# Patient Record
Sex: Male | Born: 1935 | Race: White | Hispanic: No | Smoking: Former smoker
Health system: Southern US, Community
[De-identification: ages and names within clinical notes are randomized; demographics above are authoritative.]

## PROBLEM LIST (undated history)

## (undated) DIAGNOSIS — Z86718 Personal history of other venous thrombosis and embolism: Secondary | ICD-10-CM

## (undated) DIAGNOSIS — C3490 Malignant neoplasm of unspecified part of unspecified bronchus or lung: Secondary | ICD-10-CM

---

## 2003-11-26 ENCOUNTER — Other Ambulatory Visit: Payer: Self-pay

## 2003-11-26 ENCOUNTER — Inpatient Hospital Stay: Payer: Self-pay | Admitting: Internal Medicine

## 2006-11-09 ENCOUNTER — Ambulatory Visit: Payer: Self-pay | Admitting: Gastroenterology

## 2008-02-02 ENCOUNTER — Ambulatory Visit: Payer: Self-pay | Admitting: Internal Medicine

## 2009-07-29 ENCOUNTER — Inpatient Hospital Stay: Payer: Self-pay | Admitting: Internal Medicine

## 2009-10-18 ENCOUNTER — Ambulatory Visit: Payer: Self-pay | Admitting: Specialist

## 2013-01-02 ENCOUNTER — Ambulatory Visit: Payer: Self-pay

## 2013-01-23 ENCOUNTER — Ambulatory Visit: Payer: Self-pay | Admitting: Vascular Surgery

## 2013-01-23 LAB — BASIC METABOLIC PANEL
Anion Gap: 3 — ABNORMAL LOW (ref 7–16)
BUN: 14 mg/dL (ref 7–18)
CALCIUM: 9.3 mg/dL (ref 8.5–10.1)
Chloride: 104 mmol/L (ref 98–107)
Co2: 32 mmol/L (ref 21–32)
Creatinine: 1.01 mg/dL (ref 0.60–1.30)
EGFR (African American): >60
EGFR (Non-African Amer.): >60
Glucose: 103 mg/dL — ABNORMAL HIGH (ref 65–99)
Osmolality: 278 (ref 275–301)
Potassium: 3.9 mmol/L (ref 3.5–5.1)
Sodium: 139 mmol/L (ref 136–145)

## 2013-02-01 ENCOUNTER — Ambulatory Visit: Payer: Self-pay | Admitting: Vascular Surgery

## 2013-02-01 LAB — BUN: BUN: 21 mg/dL — ABNORMAL HIGH (ref 7–18)

## 2013-02-01 LAB — CREATININE, SERUM
Creatinine: 1.15 mg/dL (ref 0.60–1.30)
EGFR (Non-African Amer.): >60

## 2013-05-18 DIAGNOSIS — I10 Essential (primary) hypertension: Secondary | ICD-10-CM | POA: Insufficient documentation

## 2013-05-18 DIAGNOSIS — R05 Cough: Secondary | ICD-10-CM | POA: Insufficient documentation

## 2013-05-18 DIAGNOSIS — R059 Cough, unspecified: Secondary | ICD-10-CM | POA: Insufficient documentation

## 2013-05-26 DIAGNOSIS — J449 Chronic obstructive pulmonary disease, unspecified: Secondary | ICD-10-CM | POA: Insufficient documentation

## 2013-05-26 DIAGNOSIS — I739 Peripheral vascular disease, unspecified: Secondary | ICD-10-CM | POA: Insufficient documentation

## 2013-06-02 DIAGNOSIS — G939 Disorder of brain, unspecified: Secondary | ICD-10-CM | POA: Insufficient documentation

## 2013-06-02 DIAGNOSIS — I6782 Cerebral ischemia: Secondary | ICD-10-CM | POA: Insufficient documentation

## 2013-06-02 DIAGNOSIS — I341 Nonrheumatic mitral (valve) prolapse: Secondary | ICD-10-CM | POA: Insufficient documentation

## 2013-06-02 DIAGNOSIS — E278 Other specified disorders of adrenal gland: Secondary | ICD-10-CM | POA: Insufficient documentation

## 2013-06-07 ENCOUNTER — Encounter: Payer: Self-pay | Admitting: Podiatrist

## 2013-06-15 ENCOUNTER — Encounter: Payer: Self-pay | Admitting: Podiatrist

## 2013-06-15 ENCOUNTER — Ambulatory Visit (INDEPENDENT_AMBULATORY_CARE_PROVIDER_SITE_OTHER): Payer: Medicare Other | Admitting: Podiatrist

## 2013-06-15 VITALS — BP 151/88 | HR 56 | Resp 16

## 2013-06-15 DIAGNOSIS — B351 Tinea unguium: Secondary | ICD-10-CM

## 2013-06-15 DIAGNOSIS — M79609 Pain in unspecified limb: Secondary | ICD-10-CM

## 2013-06-15 NOTE — Progress Notes (Signed)
   HPI:  Patient presents today for follow up of foot and nail care. Denies any new complaints today.  Objective:  Patients chart is reviewed.  Vascular status reveals pedal pulses noted at 2 out of 4 dp and pt bilateral .  Neurological sensation is Normal to [employer] monofilament bilateral.  Patients nails are thickened, discolored, distrophic, friable and brittle with yellow-brown discoloration bilateral hallux nails only.  He is able to trim the remainder of the nails on his own. Patient subjectively relates they are painful with shoes and with ambulation of bilateral feet.  Assessment:  Symptomatic onychomycosis x 2 toenails.   Plan:  Discussed treatment options and alternatives.  The symptomatic toenails were debrided through manual an mechanical means without complication.  Return appointment recommended at routine intervals of 3 months    Trudie Buckler, DPM

## 2013-07-27 DIAGNOSIS — G8918 Other acute postprocedural pain: Secondary | ICD-10-CM | POA: Insufficient documentation

## 2014-02-15 ENCOUNTER — Ambulatory Visit: Payer: Self-pay | Admitting: Podiatrist

## 2014-02-19 ENCOUNTER — Ambulatory Visit: Payer: Self-pay

## 2014-03-05 ENCOUNTER — Ambulatory Visit (INDEPENDENT_AMBULATORY_CARE_PROVIDER_SITE_OTHER): Payer: Medicare Other | Admitting: Podiatry

## 2014-03-05 DIAGNOSIS — B351 Tinea unguium: Secondary | ICD-10-CM | POA: Diagnosis not present

## 2014-03-05 DIAGNOSIS — M79676 Pain in unspecified toe(s): Secondary | ICD-10-CM

## 2014-03-05 NOTE — Progress Notes (Signed)
° °  HPI:  Patient presents today for follow up of foot and nail care. Denies any new complaints today.  Objective:  Patients chart is reviewed.  Vascular status reveals pedal pulses noted at 2 out of 4 dp and pt bilateral .  Neurological sensation is Normal to [employer] monofilament bilateral.  Patients nails are thickened, discolored, distrophic, friable and brittle with yellow-brown discoloration bilateral hallux nails only.  He is able to trim the remainder of the nails on his own. Patient subjectively relates they are painful with shoes and with ambulation of bilateral feet.  Assessment:  Symptomatic onychomycosis x 2 toenails.   Plan:  Discussed treatment options and alternatives.  The symptomatic toenails were debrided through manual an mechanical means without complication.  Return appointment recommended at routine intervals of 3 month            Receiving chemo for lung CA.

## 2014-04-28 NOTE — Op Note (Signed)
PATIENT NAME:  Dustin Poole, Dustin Poole MR#:  [id number] DATE OF BIRTH:  09/11/1935  DATE OF PROCEDURE:  02/01/2013  PREOPERATIVE DIAGNOSES: 1.  Peripheral arterial disease with rest pain, right lower extremity.  2.  Hypertension.  3.  History of alcoholic pancreatitis.  4.  Hyperlipidemia.   POSTOPERATIVE DIAGNOSES:  1.  Peripheral arterial disease with rest pain, right lower extremity.  2.  Hypertension.  3.  History of alcoholic pancreatitis.  4.  Hyperlipidemia.   PROCEDURES:  1.  Catheter placement to right anterior tibial artery and right posterior tibial artery from left femoral approach.  2.   Aortogram and selective right lower extremity angiogram.  3.  Percutaneous transluminal angioplasty of right tibioperoneal trunk and posterior tibial artery with 3 mm diameter angioplasty balloon.  4.  Percutaneous transluminal angioplasty of right anterior tibial artery with 3 mm and 4 mm diameter angioplasty balloon.  5.  Percutaneous transluminal angioplasty of right popliteal artery with a 5 mm diameter angioplasty balloon. 6.  Percutaneous transluminal angioplasty of right common iliac artery with a 7 mm diameter angioplasty balloon.  7.  Balloon expandable stent placement to right common iliac artery with an 8 mm diameter x 59 mm length balloon expandable stent.  8.  StarClose closure device, left femoral artery.   SURGEON: Algernon Huxley, M.D.   ANESTHESIA: Local with moderate conscious sedation   BLOOD LOSS: Minimal.   FLUOROSCOPY TIME: 9.2 minutes, and 95 mL of contrast used.  INDICATION FOR PROCEDURE: A 79 year old white male who presented to our office earlier this month with ischemic rest pain of the right foot. He had a undetectable ABIs at that time. He was probably scheduled for angiogram a couple of weeks ago, which he had canceled and rescheduled for today. He is brought in today for angiography for further evaluation of his disease in hopes of treatment for limb salvage. Risks and  benefits were discussed. Informed consent was obtained.   DESCRIPTION OF PROCEDURE: The patient was brought to the vascular and interventional radiology suite. Groins were shaved and prepped, a sterile surgical field was created. The left femoral head was localized with fluoroscopy. The left femoral artery was accessed without difficulty with a Seldinger needle. A J-wire and a 5 French sheath were then placed. Pigtail catheter was placed in the aorta at the L1-L2 level, and AP aortogram was performed, which showed what appeared to be normal flow through the renal vessels bilaterally. I then pulled down the pigtail catheter, and the aortic bifurcation and pelvic obliques were performed. This showed a near-flush occlusion of the right common iliac artery, with reconstitution through the hypogastric artery and the external iliac artery on the right. I used a rim catheter and an Advantage wire. I was able to cross this lesion with surprisingly little difficulty and confirm intraluminal flow in the right external iliac artery. Selective right lower extremity angiogram was then performed. This demonstrated normal common femoral artery, profunda femoris artery, superficial femoral artery. The popliteal artery at the level of the knee had near-occlusive thrombus, which at the tibial trifurcation became occlusive. The anterior tibial artery was only occluded over a 2 cm subsegment proximally. The tibioperoneal trunk was occluded. The posterior tibial artery  reconstituted in the mid to upper calf. The patient was fully heparinized. I placed a 6 [place] [employer] sheath over the [employer] wire across the iliac occlusion.   I initially turned my attention to crossing the popliteal and tibial lesions, which I was able to get  a Kumpe catheter and a V18 wire into the posterior tibial artery without difficulty, and confirm intraluminal flow with the Kumpe catheter, with reconstitution of the posterior tibial artery. The  0.018 wire was then replaced as this had been present for several weeks. I did not feel that injection of tPA at this time was going to be of much benefit. It did appear more embolic in nature, and possibly acute on chronic disease. Balloon angioplasty was performed in the tibioperoneal trunk and posterior tibial artery with a 3 mm diameter angioplasty balloon inflated up to the distal popliteal artery. A waist was taken, which resolved with angioplasty. The popliteal artery was then treated with a 5 mm diameter angioplasty balloon from just above the origin of the anterior tibial artery up into the above-knee popliteal artery. Completion angiogram following this showed significant improvement in the popliteal artery, with the tibioperoneal trunk, and peroneal and posterior tibial arteries continued to show minimal flow. I then turned my attention to the anterior tibial artery, where a more robust vessel was present, with a shorter segment of occlusion. I was able to gain access to this without difficulty with the Kumpe catheter and the V-18 wire, and confirm intraluminal flow in the anterior tibial artery beyond the occlusion. The remainder of the vessel appeared pretty good to the foot. I then replaced the 0.018 wire and treated this initially with a 3 mm diameter angioplasty balloon, with a suboptimal result, upsized to a 4 mm diameter angioplasty balloon, with resolution of flow through the anterior tibial artery, and about a 40% to 50% residual stenosis. The popliteal artery had decent flow, as did the anterior tibial artery as well now. At this point, I turned my attention to the iliac lesion. The sheath was pulled back to the aortic bifurcation. We had exchange back for the Advantage wire. Angioplasty was performed to the right common iliac artery with a 7 mm diameter angioplasty balloon. Following angioplasty, there remained a high-grade residual stenosis throughout much of the common iliac artery, and I  elected to treat this with an 8 mm diameter x 59 mm length balloon expandable stent. This was deployed with its proximal endpoint about 0.5 cm below the aortic bifurcation, not to impinge upon the left iliac artery, and it terminated at the level of the iliac bifurcation. Brisk flow was seen through this following stent placement, which was markedly improved from baseline.   At this point, I elected to terminate the procedure. The sheath was pulled back to the ipsilateral external iliac artery, and oblique arteriogram was performed. StarClose closure device was deployed in the usual fashion, with excellent hemostatic result. The patient tolerated the procedure well, and was taken to the recovery room in stable condition.     ____________________________ Algernon Huxley, MD jsd:mr D: 02/01/2013 16:29:44 ET T: 02/01/2013 19:11:56 ET JOB#: [id number]  cc: Algernon Huxley, MD, <Dictator> John B. Sarina Ser, MD  Algernon Huxley MD ELECTRONICALLY SIGNED 02/07/2013 11:05

## 2014-12-05 ENCOUNTER — Ambulatory Visit (INDEPENDENT_AMBULATORY_CARE_PROVIDER_SITE_OTHER): Payer: Medicare Other | Admitting: Podiatry

## 2014-12-05 ENCOUNTER — Encounter: Payer: Self-pay | Admitting: Podiatry

## 2014-12-05 DIAGNOSIS — M79676 Pain in unspecified toe(s): Secondary | ICD-10-CM

## 2014-12-05 DIAGNOSIS — B351 Tinea unguium: Secondary | ICD-10-CM | POA: Diagnosis not present

## 2014-12-05 NOTE — Progress Notes (Signed)
He presents today with chief complaint of painful elongated toenails.  Objective: Vital signs are stable he is alert and oriented 3 pulses are palpable bilateral. Nails are thick yellow dystrophic onychomycotic and painful on palpation.  Assessment: Pain in limb secondary to onychomycosis 1 through 5 bilateral.  Plan: Discussed etiology pathology conservative versus surgical therapy debrided nails 1 through 5 bilateral covers her secondary pain. Follow up with him in 3 months.

## 2015-03-06 ENCOUNTER — Ambulatory Visit: Payer: Medicare Other | Admitting: Podiatry

## 2015-05-20 ENCOUNTER — Encounter: Payer: Self-pay | Admitting: Podiatry

## 2015-05-20 ENCOUNTER — Ambulatory Visit (INDEPENDENT_AMBULATORY_CARE_PROVIDER_SITE_OTHER): Payer: Medicare Other | Admitting: Podiatry

## 2015-05-20 DIAGNOSIS — M79676 Pain in unspecified toe(s): Secondary | ICD-10-CM | POA: Diagnosis not present

## 2015-05-20 DIAGNOSIS — B351 Tinea unguium: Secondary | ICD-10-CM

## 2015-05-20 NOTE — Progress Notes (Signed)
He presents today with a chief complaint of painful elongated toenails bilateral.  Objective: Vital signs are stable he is alert and oriented 3. He seems a little tired today. Her nails are thick yellow dystrophic with mycotic and painful palpation.  Assessment: History of malignant adrenal gland tumor. Pain in limb secondary to onychomycosis.  Plan: Debridement of toenails 1 through 5 bilateral covered service secondary to pain and will follow up with [place] on an as-needed basis.

## 2016-07-02 ENCOUNTER — Ambulatory Visit: Payer: Medicare Other | Admitting: Podiatry

## 2016-07-23 ENCOUNTER — Encounter: Payer: Self-pay | Admitting: Podiatry

## 2016-07-23 ENCOUNTER — Ambulatory Visit (INDEPENDENT_AMBULATORY_CARE_PROVIDER_SITE_OTHER): Payer: Medicare Other | Admitting: Podiatry

## 2016-07-23 DIAGNOSIS — M79676 Pain in unspecified toe(s): Secondary | ICD-10-CM | POA: Diagnosis not present

## 2016-07-23 DIAGNOSIS — B351 Tinea unguium: Secondary | ICD-10-CM | POA: Diagnosis not present

## 2016-07-23 NOTE — Progress Notes (Signed)
Complaint:  Visit Type: Patient returns to my office for continued preventative foot care services. Complaint: Patient states" my nails have grown long and thick and become painful to walk and wear shoes"  The patient presents for preventative foot care services. No changes to ROS  Podiatric Exam: Vascular: dorsalis pedis and posterior tibial pulses are palpable bilateral. Capillary return is immediate. Temperature gradient is WNL. Skin turgor WNL  Sensorium: Normal Semmes [employer] monofilament test. Normal tactile sensation bilaterally. Nail Exam: Pt has thick disfigured discolored nails with subungual debris noted bilateral entire nail hallux through fifth toenails Ulcer Exam: There is no evidence of ulcer or pre-ulcerative changes or infection. Orthopedic Exam: Muscle tone and strength are WNL. No limitations in general ROM. No crepitus or effusions noted. Foot type and digits show no abnormalities. Bony prominences are unremarkable. Ankle swelling  B/L Skin: No Porokeratosis. No infection or ulcers  Diagnosis:  Onychomycosis, , Pain in right toe, pain in left toes  Treatment & Plan Procedures and Treatment: Consent by patient was obtained for treatment procedures. The patient understood the discussion of treatment and procedures well. All questions were answered thoroughly reviewed. Debridement of mycotic and hypertrophic toenails, 1 through 5 bilateral and clearing of subungual debris. No ulceration, no infection noted.  Return Visit-Office Procedure: Patient instructed to return to the office for a follow up visit 3 months for continued evaluation and treatment.    Gardiner Barefoot DPM

## 2016-10-26 ENCOUNTER — Ambulatory Visit: Payer: Medicare Other | Admitting: Podiatry

## 2016-10-29 ENCOUNTER — Ambulatory Visit: Payer: Medicare Other | Admitting: Podiatry

## 2017-01-07 ENCOUNTER — Inpatient Hospital Stay
Admission: EM | Admit: 2017-01-07 | Discharge: 2017-02-05 | DRG: 699 | Disposition: E | Attending: Internal Medicine | Admitting: Internal Medicine

## 2017-01-07 ENCOUNTER — Encounter: Payer: Self-pay | Admitting: Emergency Medicine

## 2017-01-07 ENCOUNTER — Other Ambulatory Visit: Payer: Self-pay

## 2017-01-07 ENCOUNTER — Emergency Department

## 2017-01-07 DIAGNOSIS — R64 Cachexia: Secondary | ICD-10-CM | POA: Diagnosis present

## 2017-01-07 DIAGNOSIS — F419 Anxiety disorder, unspecified: Secondary | ICD-10-CM | POA: Diagnosis present

## 2017-01-07 DIAGNOSIS — N401 Enlarged prostate with lower urinary tract symptoms: Secondary | ICD-10-CM | POA: Diagnosis present

## 2017-01-07 DIAGNOSIS — M069 Rheumatoid arthritis, unspecified: Secondary | ICD-10-CM | POA: Diagnosis present

## 2017-01-07 DIAGNOSIS — L89152 Pressure ulcer of sacral region, stage 2: Secondary | ICD-10-CM | POA: Diagnosis present

## 2017-01-07 DIAGNOSIS — Z86718 Personal history of other venous thrombosis and embolism: Secondary | ICD-10-CM

## 2017-01-07 DIAGNOSIS — R338 Other retention of urine: Secondary | ICD-10-CM | POA: Diagnosis present

## 2017-01-07 DIAGNOSIS — Z87440 Personal history of urinary (tract) infections: Secondary | ICD-10-CM

## 2017-01-07 DIAGNOSIS — T83091A Other mechanical complication of indwelling urethral catheter, initial encounter: Principal | ICD-10-CM

## 2017-01-07 DIAGNOSIS — L8990 Pressure ulcer of unspecified site, unspecified stage: Secondary | ICD-10-CM

## 2017-01-07 DIAGNOSIS — R310 Gross hematuria: Secondary | ICD-10-CM | POA: Diagnosis not present

## 2017-01-07 DIAGNOSIS — Z681 Body mass index (BMI) 19 or less, adult: Secondary | ICD-10-CM

## 2017-01-07 DIAGNOSIS — R319 Hematuria, unspecified: Secondary | ICD-10-CM | POA: Diagnosis present

## 2017-01-07 DIAGNOSIS — Z7901 Long term (current) use of anticoagulants: Secondary | ICD-10-CM

## 2017-01-07 DIAGNOSIS — Z515 Encounter for palliative care: Secondary | ICD-10-CM | POA: Diagnosis not present

## 2017-01-07 DIAGNOSIS — Z7982 Long term (current) use of aspirin: Secondary | ICD-10-CM

## 2017-01-07 DIAGNOSIS — N320 Bladder-neck obstruction: Secondary | ICD-10-CM

## 2017-01-07 DIAGNOSIS — C3490 Malignant neoplasm of unspecified part of unspecified bronchus or lung: Secondary | ICD-10-CM | POA: Diagnosis present

## 2017-01-07 DIAGNOSIS — Z87891 Personal history of nicotine dependence: Secondary | ICD-10-CM

## 2017-01-07 DIAGNOSIS — F329 Major depressive disorder, single episode, unspecified: Secondary | ICD-10-CM | POA: Diagnosis present

## 2017-01-07 DIAGNOSIS — Z66 Do not resuscitate: Secondary | ICD-10-CM | POA: Diagnosis present

## 2017-01-07 DIAGNOSIS — E871 Hypo-osmolality and hyponatremia: Secondary | ICD-10-CM

## 2017-01-07 DIAGNOSIS — Y738 Miscellaneous gastroenterology and urology devices associated with adverse incidents, not elsewhere classified: Secondary | ICD-10-CM | POA: Diagnosis present

## 2017-01-07 HISTORY — DX: Malignant neoplasm of unspecified part of unspecified bronchus or lung: C34.90

## 2017-01-07 HISTORY — DX: Personal history of other venous thrombosis and embolism: Z86.718

## 2017-01-07 LAB — CBC WITH DIFFERENTIAL/PLATELET
BASOS PCT: 1 %
Basophils Absolute: 0.1 10*3/uL (ref 0–0.1)
EOS ABS: 0 10*3/uL (ref 0–0.7)
Eosinophils Relative: 0 %
HEMATOCRIT: 32.1 % — AB (ref 40.0–52.0)
Hemoglobin: 10.5 g/dL — ABNORMAL LOW (ref 13.0–18.0)
LYMPHS ABS: 0.3 10*3/uL — AB (ref 1.0–3.6)
Lymphocytes Relative: 7 %
MCH: 31.3 pg (ref 26.0–34.0)
MCHC: 32.8 g/dL (ref 32.0–36.0)
MCV: 95.3 fL (ref 80.0–100.0)
MONO ABS: 0.1 10*3/uL — AB (ref 0.2–1.0)
Monocytes Relative: 1 %
NEUTROS ABS: 4.2 10*3/uL (ref 1.4–6.5)
Neutrophils Relative %: 91 %
Platelets: 482 10*3/uL — ABNORMAL HIGH (ref 150–440)
RBC: 3.36 MIL/uL — ABNORMAL LOW (ref 4.40–5.90)
RDW: 20.8 % — AB (ref 11.5–14.5)
WBC: 4.6 10*3/uL (ref 3.8–10.6)

## 2017-01-07 LAB — URINALYSIS, COMPLETE (UACMP) WITH MICROSCOPIC
BACTERIA UA: NONE SEEN
SQUAMOUS EPITHELIAL / LPF: NONE SEEN
Specific Gravity, Urine: 1.019 (ref 1.005–1.030)
WBC UA: NONE SEEN WBC/hpf (ref 0–5)

## 2017-01-07 LAB — BASIC METABOLIC PANEL
Anion gap: 10 (ref 5–15)
BUN: 17 mg/dL (ref 6–20)
CALCIUM: 8.6 mg/dL — AB (ref 8.9–10.3)
CHLORIDE: 97 mmol/L — AB (ref 101–111)
CO2: 21 mmol/L — AB (ref 22–32)
CREATININE: 0.84 mg/dL (ref 0.61–1.24)
GFR calc non Af Amer: >60 mL/min (ref >60)
Glucose, Bld: 178 mg/dL — ABNORMAL HIGH (ref 65–99)
Potassium: 4.5 mmol/L (ref 3.5–5.1)
Sodium: 128 mmol/L — ABNORMAL LOW (ref 135–145)

## 2017-01-07 LAB — PROTIME-INR
INR: 1.47
Prothrombin Time: 17.7 seconds — ABNORMAL HIGH (ref 11.4–15.2)

## 2017-01-07 LAB — APTT: aPTT: 43 seconds — ABNORMAL HIGH (ref 24–36)

## 2017-01-07 MED ORDER — ONDANSETRON HCL 4 MG PO TABS: 4.0000 mg | ORAL_TABLET | Freq: Four times a day (QID) | ORAL | Status: DC | PRN

## 2017-01-07 MED ORDER — FOLIC ACID 1 MG PO TABS
1.0000 mg | ORAL_TABLET | Freq: Every day | ORAL | Status: DC
  Administered 2017-01-09: 1 mg via ORAL
  Filled 2017-01-07: qty 1

## 2017-01-07 MED ORDER — SODIUM CHLORIDE 0.9 % IV BOLUS (SEPSIS)
500.0000 mL | Freq: Once | INTRAVENOUS | Status: AC
  Administered 2017-01-07: 250 mL via INTRAVENOUS

## 2017-01-07 MED ORDER — ACETAMINOPHEN 650 MG RE SUPP: 650.0000 mg | Freq: Four times a day (QID) | RECTAL | Status: DC | PRN

## 2017-01-07 MED ORDER — OXYCODONE-ACETAMINOPHEN 5-325 MG PO TABS
1.0000 | ORAL_TABLET | ORAL | Status: DC | PRN
  Administered 2017-01-07: 1 via ORAL
  Filled 2017-01-07: qty 1

## 2017-01-07 MED ORDER — LORAZEPAM 0.5 MG PO TABS
0.5000 mg | ORAL_TABLET | Freq: Four times a day (QID) | ORAL | Status: DC | PRN
  Administered 2017-01-07: 23:00:00 1 mg via ORAL
  Filled 2017-01-07: qty 2

## 2017-01-07 MED ORDER — BENZONATATE 100 MG PO CAPS
200.0000 mg | ORAL_CAPSULE | Freq: Three times a day (TID) | ORAL | Status: DC
  Administered 2017-01-07 – 2017-01-09 (×3): 200 mg via ORAL
  Filled 2017-01-07 (×3): qty 2

## 2017-01-07 MED ORDER — ONDANSETRON HCL 4 MG/2ML IJ SOLN: 4.0000 mg | Freq: Four times a day (QID) | INTRAMUSCULAR | Status: DC | PRN

## 2017-01-07 MED ORDER — DOCUSATE SODIUM 100 MG PO CAPS
200.0000 mg | ORAL_CAPSULE | Freq: Two times a day (BID) | ORAL | Status: DC
  Administered 2017-01-07 – 2017-01-09 (×2): 200 mg via ORAL
  Filled 2017-01-07 (×2): qty 2

## 2017-01-07 MED ORDER — OXYBUTYNIN CHLORIDE ER 10 MG PO TB24
10.0000 mg | ORAL_TABLET | Freq: Two times a day (BID) | ORAL | Status: DC
  Administered 2017-01-07 – 2017-01-10 (×4): 10 mg via ORAL
  Filled 2017-01-07 (×8): qty 1

## 2017-01-07 MED ORDER — MIRTAZAPINE 15 MG PO TABS
7.5000 mg | ORAL_TABLET | Freq: Every day | ORAL | Status: DC
  Administered 2017-01-07: 7.5 mg via ORAL
  Filled 2017-01-07: qty 1

## 2017-01-07 MED ORDER — SERTRALINE HCL 50 MG PO TABS: 100.0000 mg | ORAL_TABLET | Freq: Every day | ORAL | Status: DC

## 2017-01-07 MED ORDER — HYDROMORPHONE HCL 1 MG/ML IJ SOLN
1.0000 mg | Freq: Once | INTRAMUSCULAR | Status: AC
  Administered 2017-01-07: 1 mg via INTRAVENOUS
  Filled 2017-01-07: qty 1

## 2017-01-07 MED ORDER — ACETAMINOPHEN 325 MG PO TABS: 650.0000 mg | ORAL_TABLET | Freq: Four times a day (QID) | ORAL | Status: DC | PRN

## 2017-01-07 MED ORDER — FINASTERIDE 5 MG PO TABS
5.0000 mg | ORAL_TABLET | Freq: Every day | ORAL | Status: DC
  Administered 2017-01-09: 08:00:00 5 mg via ORAL
  Filled 2017-01-07: qty 1

## 2017-01-07 NOTE — ED Provider Notes (Signed)
[facility] Emergency Department Provider Note  ____________________________________________  Time seen: Approximately 4:25 PM  I have reviewed the triage vital signs and the nursing notes.   HISTORY  Chief Complaint Hematuria    HPI Dustin Poole is a 82 y.o. male under hospice care for metastatic lung cancer according to the hospice coordinator, brought to the ED due to hematuria. He is on Eliquis due to prior venous thrombosis issues associated with his cancer. Today, he started having hematuria in his Foley collection bag. They tried irrigating the Foley but it would not drain, so they replaced the Foley which didn't produce 200 mL of bloody output. However, it stopped draining again and flushing the tubing was unable to facilitate drainage. The patient complains of pelvic pain. Denies fever chills sweats or vomiting.  CODE STATUS is DO NOT RESUSCITATE, but scope of care does include antibiotics, IV fluids, and hospitalization if necessary.  Has full VA benefits and would want to be transferred to the [place] if feasible in the event that he needs hospitalization.  Family also report the patient had a fall at home about a week ago and has had pain with weightbearing on the left leg since then. He had a left hip x-ray to evaluate which was unremarkable at the time.   Past Medical History:  Diagnosis Date  . History of DVT (deep vein thrombosis)   . Lung cancer [facility])      Patient Active Problem List   Diagnosis Date Noted  . Pain following surgery or procedure 07/27/2013  . Adrenal mass ([place]) 06/02/2013  . Billowing mitral valve 06/02/2013  . Temporary cerebral vascular dysfunction 06/02/2013  . CAFL (chronic airflow limitation) ([place]) 05/26/2013  . Peripheral blood vessel disorder ([place]) 05/26/2013  . Cough 05/18/2013  . BP (high blood pressure) 05/18/2013        Prior to Admission medications   Medication Sig Start Date End Date Taking?  Authorizing Provider  aspirin 81 MG tablet Take 81 mg by mouth daily.    [provider]  Budesonide-Formoterol Fumarate (SYMBICORT IN) Inhale into the lungs daily.    [provider]  enoxaparin (LOVENOX) 80 MG/0.8ML injection Inject into the skin.    [provider]  Melatonin 10 MG CAPS Take by mouth.    [provider]  mirtazapine (REMERON) 7.5 MG tablet Take 7.5 mg by mouth at bedtime.    [provider]  ondansetron (ZOFRAN-ODT) 4 MG disintegrating tablet Take 4 mg by mouth 2 (two) times daily.    [provider]  PACLitaxel (TAXOL IV) Inject into the vein.    [provider]  senna-docusate (SENOKOT-S) 8.6-50 MG per tablet Take by mouth.    [provider]  sildenafil (VIAGRA) 100 MG tablet Take by mouth.    [provider]  SIMVASTATIN PO Take by mouth daily.    [provider]  traZODone (DESYREL) 50 MG tablet Take by mouth.    [provider]     Allergies Patient has no known allergies.   No family history on file.  Social History Social History   Tobacco Use  . Smoking status: Former Smoker    Packs/day: 2.00    Years: 55.00    Pack years: 110.00    Types: Cigarettes  . Smokeless tobacco: Current User  Substance Use Topics  . Alcohol use: Yes  . Drug use: No    Review of Systems  Constitutional:   No fever or chills.  ENT:   No sore throat. No rhinorrhea. Cardiovascular:   No chest pain or syncope. Respiratory:   No dyspnea or cough. Gastrointestinal:   Positive as above for of low abdominal pain without vomiting or diarrhea. No constipation. Had a bowel movement on arrival to the ED Musculoskeletal:   Left thigh pain for the past week All other systems reviewed and are negative except as documented above in ROS and HPI.  ____________________________________________   PHYSICAL EXAM:  VITAL SIGNS: ED Triage Vitals  Enc Vitals Group     BP 01/11/2017 1532  104/74     Pulse Rate 01/15/2017 1532 (!) 104     Resp 01/28/2017 1532 (!) 21     Temp 02/01/2017 1536 97.8 F (36.6 C)     Temp Source 01/25/2017 1536 Oral     SpO2 01/29/2017 1532 94 %     Weight --      Height 01/20/2017 1532 6\' 2"  (1.88 m)     Head Circumference --      Peak Flow --      Pain Score --      Pain Loc --      Pain Edu? --      Excl. in [place]? --     Vital signs reviewed, nursing assessments reviewed.   Constitutional:   Alert and oriented. Uncomfortable but not in distress. Emaciated appearance Eyes:   No scleral icterus.  EOMI. No nystagmus. No conjunctival pallor. PERRL. ENT   Head:   Normocephalic and atraumatic.   Nose:   No congestion/rhinnorhea.    Mouth/Throat:   MMM, no pharyngeal erythema. No peritonsillar mass.    Neck:   No meningismus. Full ROM. Hematological/Lymphatic/Immunilogical:   No cervical lymphadenopathy. Cardiovascular:   Tachycardia heart rate 105. Symmetric bilateral radial and DP pulses.  No murmurs.  Respiratory:   Normal respiratory effort without tachypnea/retractions. Breath sounds are clear and equal bilaterally. No wheezes/rales/rhonchi. Gastrointestinal:   Soft with distended tender bladder. . There is no CVA tenderness.  No rebound, rigidity, or guarding. Genitourinary:   deferred Musculoskeletal:   Normal range of motion in all extremities. No joint effusions.  Mild tenderness in the area of the left midshaft femur without instability or crepitus..  No edema. Neurologic:   Normal speech and language.  Motor grossly intact. No gross focal neurologic deficits are appreciated.  Skin:    Skin is warm, dry and intact. No rash noted.  No petechiae, purpura, or bullae.  ____________________________________________    LABS (pertinent positives/negatives) (all labs ordered are listed, but only abnormal results are displayed) Labs Reviewed  CBC WITH DIFFERENTIAL/PLATELET - Abnormal; Notable for the following components:      Result  Value   RBC 3.36 (*)    Hemoglobin 10.5 (*)    HCT 32.1 (*)    RDW 20.8 (*)    Platelets 482 (*)    Lymphs Abs 0.3 (*)    Monocytes Absolute 0.1 (*)    All other components within normal limits  BASIC METABOLIC PANEL - Abnormal; Notable for the following components:   Sodium 128 (*)    Chloride 97 (*)    CO2 21 (*)    Glucose, Bld 178 (*)    Calcium 8.6 (*)    All other components within normal limits  PROTIME-INR - Abnormal; Notable for the following components:   Prothrombin Time 17.7 (*)    All other components within normal limits  APTT - Abnormal; Notable for the following components:  aPTT 43 (*)    All other components within normal limits  URINALYSIS, COMPLETE (UACMP) WITH MICROSCOPIC - Abnormal; Notable for the following components:   Color, Urine RED (*)    APPearance CLOUDY (*)    Glucose, UA   (*)    Value: TEST NOT REPORTED DUE TO COLOR INTERFERENCE OF URINE PIGMENT   Hgb urine dipstick   (*)    Value: TEST NOT REPORTED DUE TO COLOR INTERFERENCE OF URINE PIGMENT   Bilirubin Urine   (*)    Value: TEST NOT REPORTED DUE TO COLOR INTERFERENCE OF URINE PIGMENT   Ketones, ur   (*)    Value: TEST NOT REPORTED DUE TO COLOR INTERFERENCE OF URINE PIGMENT   Protein, ur   (*)    Value: TEST NOT REPORTED DUE TO COLOR INTERFERENCE OF URINE PIGMENT   Nitrite   (*)    Value: TEST NOT REPORTED DUE TO COLOR INTERFERENCE OF URINE PIGMENT   Leukocytes, UA   (*)    Value: TEST NOT REPORTED DUE TO COLOR INTERFERENCE OF URINE PIGMENT   All other components within normal limits  URINE CULTURE   ____________________________________________   EKG    ____________________________________________    RADIOLOGY  Dg Femur Min 2 Views Left  Result Date: 01/28/2017 CLINICAL DATA:  Left midshaft femur pain. EXAM: LEFT FEMUR 2 VIEWS COMPARISON:  None. FINDINGS: The femur appears osteopenic but intact and located. Possible left superior pubic ramus fracture, but area only covered on  one view. No visible sclerotic bone lesion. Atherosclerotic calcification. IMPRESSION: 1. No evidence of fracture or metastasis in the left femur. 2. Possible nondisplaced left superior pubic ramus fracture, but area only covered on one view. Electronically Signed   By: Monte Fantasia M.D.   On: 01/08/2017 16:37    ____________________________________________   PROCEDURES Procedures  ____________________________________________     CLINICAL IMPRESSION / ASSESSMENT AND PLAN / ED COURSE  Pertinent labs & imaging results that were available during my care of the patient were reviewed by me and considered in my medical decision making (see chart for details).     Clinical Course as of Jan 07 1946  Thu Jan 07, 2017  1603 Pt p/w hematuria, likely foley obstruction from clotted blood. Bladder scan and foley irrigation, may need CBI. Check ua, bmp. IVF bolus for urinary dilution. Not septic.  Pt in hospice, but scope of care includes fluids, abx, and hospital admission if necessary. Has VA benefits.  [PS]  0254 Bladder scan with 300+ mL. No urine output with foley irrigation. Will replace foley with CBI catheter. Plan to admit. I discussed the patient's care with [place], advises they are on full diversion and pt will need to be admitted here at [facility] for ongoing care.   [PS]    Clinical Course User Index [PS] Carrie Mew, MD    ----------------------------------------- 7:48 PM on 01/15/2017 -----------------------------------------  Achieving irrigation through through new irrigation Foley. Patient starting to feel better. Case discussed with hospitalist for further care.   ____________________________________________   FINAL CLINICAL IMPRESSION(S) / ED DIAGNOSES    Final diagnoses:  Hyponatremia  Gross hematuria  Obstructed Foley catheter, initial encounter ([place])      This SmartLink is deprecated. Use AVSMEDLIST instead to display the  medication list for a patient.   Portions of this note were generated with dragon dictation software. Dictation errors may occur despite best attempts at proofreading.    Carrie Mew, MD 01/31/2017 1949

## 2017-01-07 NOTE — H&P (Signed)
[facility] NAME: Dustin Poole    MR#:  [id number]  DATE OF BIRTH:  Apr 11, 1935  DATE OF ADMISSION:  01/16/2017  PRIMARY CARE PHYSICIAN: [name], MD   REQUESTING/REFERRING PHYSICIAN: Dr. Brenton Grills  CHIEF COMPLAINT:   Chief Complaint  Patient presents with  . Hematuria    HISTORY OF PRESENT ILLNESS:  Dustin Poole  is a 82 y.o. male with a known history of lung cancer, previous history of DVT, rheumatoid arthritis, BPH status post chronic indwelling Foley, who presents to the hospital from home due to abdominal pain and having urinary retention. Patient is followed by hospice services at home due to his lung cancer. Today suddenly patient developed hematuria and he has a chronic indwelling Foley. The hospice nurse came out at home changed his Foley catheter out but patient continued to have further abdominal pain and hematuria with clots. Patient was sent to the ER for further evaluation. Patient was given multiple doses of IV Dilaudid and was started on a continuous bladder irrigation. Hospitalist services were contacted further treatment and evaluation.  PAST MEDICAL HISTORY:   Past Medical History:  Diagnosis Date  . History of DVT (deep vein thrombosis)   . Lung cancer ([place])     PAST SURGICAL HISTORY:   None  SOCIAL HISTORY:   Social History   Tobacco Use  . Smoking status: Former Smoker    Packs/day: 2.00    Years: 55.00    Pack years: 110.00    Types: Cigarettes  . Smokeless tobacco: Current User  Substance Use Topics  . Alcohol use: Yes    FAMILY HISTORY:  No family history on file.  DRUG ALLERGIES:  No Known Allergies  REVIEW OF SYSTEMS:   Review of Systems  Constitutional: Negative for fever and weight loss.  HENT: Negative for congestion, nosebleeds and tinnitus.   Eyes: Negative for blurred vision, double vision and redness.  Respiratory: Negative for cough, hemoptysis and shortness of  breath.   Cardiovascular: Negative for chest pain, orthopnea, leg swelling and PND.  Gastrointestinal: Positive for abdominal pain. Negative for diarrhea, melena, nausea and vomiting.  Genitourinary: Negative for dysuria, hematuria and urgency.  Musculoskeletal: Negative for falls and joint pain.  Neurological: Positive for weakness. Negative for dizziness, tingling, sensory change, focal weakness, seizures and headaches.  Endo/Heme/Allergies: Negative for polydipsia. Does not bruise/bleed easily.  Psychiatric/Behavioral: Negative for depression and memory loss. The patient is not nervous/anxious.     MEDICATIONS AT HOME:   Prior to Admission medications   Medication Sig Start Date End Date Taking? Authorizing Provider  Adalimumab (HUMIRA PEN) 40 MG/0.4ML PNKT Inject 0.4 mLs into the skin once a week.   Yes [provider]  albuterol (PROVENTIL) (2.5 MG/3ML) 0.083% nebulizer solution Take 2.5 mg by nebulization every 6 (six) hours as needed for wheezing or shortness of breath.   Yes [provider]  aspirin 81 MG tablet Take 81 mg by mouth daily.   Yes [provider]  benzonatate (TESSALON) 200 MG capsule Take 1 capsule by mouth 3 (three) times daily. 12/18/16  Yes [provider]  docusate sodium (COLACE) 100 MG capsule Take 200 mg by mouth 2 (two) times daily. 12/25/16  Yes [provider]  ELIQUIS 2.5 MG TABS tablet Take 2.5 mg by mouth 2 (two) times daily. 12/30/16  Yes [provider]  finasteride (PROSCAR) 5 MG tablet Take 1 tablet by mouth daily. 12/24/16  Yes  [provider]  folic acid (FOLVITE) 1 MG tablet Take 1 mg by mouth daily.   Yes [provider]  guaiFENesin-codeine 100-10 MG/5ML syrup Take 10 mLs by mouth at bedtime. 12/12/16  Yes [provider]  Lactulose 20 GM/30ML SOLN Take by mouth.   Yes [provider]  LORazepam (ATIVAN) 0.5 MG tablet Take 1-2 tablets by mouth every 6 (six)  hours as needed. 12/24/16  Yes [provider]  methotrexate 2.5 MG tablet Take 12.5 mg by mouth 2 (two) times a week.   Yes [provider]  mirtazapine (REMERON) 7.5 MG tablet Take 7.5 mg by mouth at bedtime.   Yes [provider]  MUCINEX 600 MG 12 hr tablet Take 1,200 mg by mouth 2 (two) times daily. 01/01/17  Yes [provider]  oxybutynin (DITROPAN-XL) 10 MG 24 hr tablet Take 10 mg by mouth 2 (two) times daily. 12/23/16  Yes [provider]  oxyCODONE-acetaminophen (PERCOCET/ROXICET) 5-325 MG tablet Take 1 tablet by mouth every 4 (four) hours as needed for severe pain.   Yes [provider]  senna-docusate (SENOKOT-S) 8.6-50 MG tablet Take 2 tablets by mouth 2 (two) times daily.   Yes [provider]  sertraline (ZOLOFT) 100 MG tablet Take 100 mg by mouth daily. 12/24/16  Yes [provider]  sorbitol 70 % solution Take 30 mLs by mouth daily.   Yes [provider]  ondansetron (ZOFRAN-ODT) 4 MG disintegrating tablet Take 4 mg by mouth 2 (two) times daily.    [provider]      VITAL SIGNS:  Blood pressure (!) 85/66, pulse (!) 104, temperature 97.8 F (36.6 C), temperature source Oral, resp. rate 20, height 6\' 2"  (1.88 m), SpO2 94 %.  PHYSICAL EXAMINATION:  Physical Exam  GENERAL:  82 y.o.-year-old cachectic patient lying in the bed in no acute distress.  EYES: Pupils equal, round, reactive to light and accommodation. No scleral icterus. Extraocular muscles intact.  HEENT: Head atraumatic, normocephalic. Oropharynx and nasopharynx clear. No oropharyngeal erythema, moist oral mucosa  NECK:  Supple, no jugular venous distention. No thyroid enlargement, no tenderness.  LUNGS: Normal breath sounds bilaterally, no wheezing, rales, rhonchi. No use of accessory muscles of respiration.  CARDIOVASCULAR: S1, S2 RRR. No murmurs, rubs, gallops, clicks.  ABDOMEN: Soft, nontender, nondistended. Bowel sounds  present. No organomegaly or mass.  EXTREMITIES: No pedal edema, cyanosis, or clubbing. + 2 pedal & radial pulses b/l.   NEUROLOGIC: Cranial nerves II through XII are intact. No focal Motor or sensory deficits appreciated b/l. Globally weak.  PSYCHIATRIC: The patient is alert and oriented x 3.  SKIN: No obvious rash, lesion, or ulcer.   CBI in place with bloody urine in foley bag.   LABORATORY PANEL:   CBC Recent Labs  Lab 01/13/2017 1640  WBC 4.6  HGB 10.5*  HCT 32.1*  PLT 482*   ------------------------------------------------------------------------------------------------------------------  Chemistries  Recent Labs  Lab 01/18/2017 1640  NA 128*  K 4.5  CL 97*  CO2 21*  GLUCOSE 178*  BUN 17  CREATININE 0.84  CALCIUM 8.6*   ------------------------------------------------------------------------------------------------------------------  Cardiac Enzymes No results for input(s): TROPONINI in the last 168 hours. ------------------------------------------------------------------------------------------------------------------  RADIOLOGY:  Dg Femur Min 2 Views Left  Result Date: 01/16/2017 CLINICAL DATA:  Left midshaft femur pain. EXAM: LEFT FEMUR 2 VIEWS COMPARISON:  None. FINDINGS: The femur appears osteopenic but intact and located. Possible left superior pubic ramus fracture, but area only covered on one view. No visible  sclerotic bone lesion. Atherosclerotic calcification. IMPRESSION: 1. No evidence of fracture or metastasis in the left femur. 2. Possible nondisplaced left superior pubic ramus fracture, but area only covered on one view. Electronically Signed   By: Monte Fantasia M.D.   On: 01/25/2017 16:37     IMPRESSION AND PLAN:   82 year old male with past medical history of lung cancer, BPH with chronic indwelling Foley, history of previous DVT, rheumatoid arthritis, depression who presents to the hospital due to abdominal pain and noted to have hematuria.  1.  Urinary retention with hematuria-secondary to BPH and patient being on anticoagulants. -Patient's Eliquis is being held. Patient currently is on a continuous bladder irrigation. [facility] consult urology. Continue supportive care with pain control and CBI.  2. Abdominal pain-secondary to the hematuria and urinary retention. Improved with pain control and CBI.  3. History of previous DVT-continue to hold Eliquis given the patient's hematuria.  4. Depression-continue Remeron, Zoloft.  5. Anxiety-continue Ativan as needed.  6. BPH-continue finasteride, oxybutynin  7. Lung cancer-patient is currently followed by hospice services at home. Once patient's hematuria has resolved and his pain is improved he can be discharged home with home hospice services.   All the records are reviewed and case discussed with ED provider. Management plans discussed with the patient, family and they are in agreement.  CODE STATUS: DO NOT RESUSCITATE  TOTAL TIME TAKING CARE OF THIS PATIENT: 40 minutes.    [name] M.D on 01/23/2017 at 8:16 PM  Between 7am to 6pm - Pager - [phone]  After 6pm go to [email or web address] - password [email or web address] [facility]  [facility]  Office  [phone]  CC: Primary care physician; [name], MD

## 2017-01-07 NOTE — Progress Notes (Signed)
ED visit made patient is currently followed by [facility] at home with a hospice diagnosis of metastatic lung cancer.He is a DNR code with out of facility DNR in place.e has been at the [facility] for respite care. Today he was sent to the ED for evaluation of blood in his catheter that was not cleared with irrigation. Patient was experiencing severe pain that was not relieved by 3 dose of morphine, 2 doses of lorazepam, oxybutynin or baclofen. Patient seen lying on the ED stretcher, alert, still in obvious pain. Family at bedside. Family confirmed patient's wish to transfer to the [facility] as all of his care is done there. Write spoke with ED attending physician Dr. Joni Fears, transfer initiated. Patient also received 1 mg dose of dilaudid for pain during writer's visit.  Writer spoke again with Dr. Joni Fears regarding transfer, at this time the [place] is on full diversion, plan is for treatment here at [facility], patient will most likely be admitted. Family aware and in agreement. Writer to follow through final disposition. IF THE PATIENT is NOT admitted to [facility] tonight please contact the hospice home at [phone] as he would return there. Flo Shanks RN, BSN, [facility] Liaison [phone]

## 2017-01-07 NOTE — ED Triage Notes (Signed)
In respite care at hospice home. Sent here for blood in foley cath.  They changed the cath.  Some confusion.  Tem 99.1 ax by ems.  Pulse 100.  Has orange dnr.

## 2017-01-07 NOTE — ED Notes (Signed)
2 more bags hung for CBI. Family states output is lighter in color then it was earlier today.

## 2017-01-07 NOTE — ED Notes (Signed)
Patient's existing catheter removed. Some bloody drainage noted from penis.

## 2017-01-07 NOTE — ED Notes (Signed)
Pt had mod amt soft brown stool.  Cleaned and changed diaper. Skin intact.  Foley anchor placed.  Family came to room.

## 2017-01-07 NOTE — ED Notes (Addendum)
Irrigation of existing catheter unsuccessful. Patient reports increased discomfort and no additional output noted. MD aware.

## 2017-01-08 ENCOUNTER — Other Ambulatory Visit: Payer: Self-pay

## 2017-01-08 ENCOUNTER — Observation Stay

## 2017-01-08 DIAGNOSIS — N320 Bladder-neck obstruction: Secondary | ICD-10-CM | POA: Diagnosis not present

## 2017-01-08 DIAGNOSIS — E871 Hypo-osmolality and hyponatremia: Secondary | ICD-10-CM | POA: Diagnosis not present

## 2017-01-08 DIAGNOSIS — R310 Gross hematuria: Secondary | ICD-10-CM | POA: Diagnosis not present

## 2017-01-08 LAB — CBC
HCT: 25.1 % — ABNORMAL LOW (ref 40.0–52.0)
Hemoglobin: 8 g/dL — ABNORMAL LOW (ref 13.0–18.0)
MCH: 30.7 pg (ref 26.0–34.0)
MCHC: 31.7 g/dL — AB (ref 32.0–36.0)
MCV: 96.8 fL (ref 80.0–100.0)
PLATELETS: 405 10*3/uL (ref 150–440)
RBC: 2.6 MIL/uL — ABNORMAL LOW (ref 4.40–5.90)
RDW: 21.1 % — AB (ref 11.5–14.5)
WBC: 3.4 10*3/uL — AB (ref 3.8–10.6)

## 2017-01-08 MED ORDER — MORPHINE SULFATE (CONCENTRATE) 10 MG/0.5ML PO SOLN
5.0000 mg | ORAL | Status: DC | PRN
  Filled 2017-01-08: qty 1

## 2017-01-08 MED ORDER — MORPHINE SULFATE (PF) 4 MG/ML IV SOLN: 4.0000 mg | INTRAVENOUS | Status: DC | PRN

## 2017-01-08 MED ORDER — HYDROMORPHONE HCL 1 MG/ML IJ SOLN
2.0000 mg | INTRAMUSCULAR | Status: DC | PRN
  Administered 2017-01-08: 1 mg via INTRAVENOUS
  Administered 2017-01-09 – 2017-01-11 (×14): 2 mg via INTRAVENOUS
  Filled 2017-01-08 (×15): qty 2

## 2017-01-08 MED ORDER — SODIUM CHLORIDE 0.9 % IV BOLUS (SEPSIS)
500.0000 mL | Freq: Once | INTRAVENOUS | Status: AC
  Administered 2017-01-08: 500 mL via INTRAVENOUS

## 2017-01-08 MED ORDER — HALOPERIDOL LACTATE 2 MG/ML PO CONC
0.5000 mg | ORAL | Status: DC | PRN
  Administered 2017-01-10: 0.5 mg via SUBLINGUAL
  Filled 2017-01-08 (×2): qty 0.3

## 2017-01-08 MED ORDER — ACETAMINOPHEN 650 MG RE SUPP: 650.0000 mg | Freq: Four times a day (QID) | RECTAL | Status: DC | PRN

## 2017-01-08 MED ORDER — ONDANSETRON HCL 4 MG/2ML IJ SOLN: 4.0000 mg | Freq: Four times a day (QID) | INTRAMUSCULAR | Status: DC | PRN

## 2017-01-08 MED ORDER — GLYCOPYRROLATE 0.2 MG/ML IJ SOLN
0.2000 mg | INTRAMUSCULAR | Status: DC | PRN
  Filled 2017-01-08: qty 1

## 2017-01-08 MED ORDER — BIOTENE DRY MOUTH MT LIQD: 15.0000 mL | OROMUCOSAL | Status: DC | PRN

## 2017-01-08 MED ORDER — GLYCOPYRROLATE 1 MG PO TABS
1.0000 mg | ORAL_TABLET | ORAL | Status: DC | PRN
  Filled 2017-01-08: qty 1

## 2017-01-08 MED ORDER — HALOPERIDOL 0.5 MG PO TABS
0.5000 mg | ORAL_TABLET | ORAL | Status: DC | PRN
  Filled 2017-01-08: qty 1

## 2017-01-08 MED ORDER — HYDROMORPHONE HCL 1 MG/ML IJ SOLN
1.0000 mg | INTRAMUSCULAR | Status: AC
  Administered 2017-01-08: 10:00:00 1 mg via INTRAVENOUS
  Filled 2017-01-08: qty 1

## 2017-01-08 MED ORDER — POLYVINYL ALCOHOL 1.4 % OP SOLN
1.0000 [drp] | Freq: Four times a day (QID) | OPHTHALMIC | Status: DC | PRN
  Filled 2017-01-08: qty 15

## 2017-01-08 MED ORDER — HALOPERIDOL LACTATE 5 MG/ML IJ SOLN
0.5000 mg | INTRAMUSCULAR | Status: DC | PRN
  Administered 2017-01-10 – 2017-01-11 (×2): 0.5 mg via INTRAVENOUS
  Filled 2017-01-08 (×2): qty 1

## 2017-01-08 MED ORDER — GLYCOPYRROLATE 0.2 MG/ML IJ SOLN
0.2000 mg | INTRAMUSCULAR | Status: DC | PRN
  Administered 2017-01-08 – 2017-01-11 (×4): 0.2 mg via INTRAVENOUS
  Filled 2017-01-08 (×5): qty 1

## 2017-01-08 MED ORDER — SODIUM CHLORIDE 0.9 % IV SOLN
INTRAVENOUS | Status: DC
  Administered 2017-01-08: 06:00:00 via INTRAVENOUS

## 2017-01-08 MED ORDER — MORPHINE SULFATE (CONCENTRATE) 10 MG/0.5ML PO SOLN
5.0000 mg | ORAL | Status: DC | PRN
  Administered 2017-01-08 – 2017-01-09 (×3): 5 mg via SUBLINGUAL
  Filled 2017-01-08 (×2): qty 1

## 2017-01-08 MED ORDER — ACETAMINOPHEN 325 MG PO TABS: 650.0000 mg | ORAL_TABLET | Freq: Four times a day (QID) | ORAL | Status: DC | PRN

## 2017-01-08 MED ORDER — ONDANSETRON 4 MG PO TBDP
4.0000 mg | ORAL_TABLET | Freq: Four times a day (QID) | ORAL | Status: DC | PRN
  Filled 2017-01-08: qty 1

## 2017-01-08 NOTE — Progress Notes (Signed)
[place] at [facility]                                                                                                                                                                                  Patient Demographics   Dustin Poole, is a 82 y.o. male, DOB - 1935/10/08, IEP:[id number]  Admit date - 01/23/2017   Admitting Physician [name], MD  Outpatient Primary MD for the patient is [name], MD   LOS - 0  Subjective:  Pt admitted with hemautria Currently sedated  Review of Systems:   [place] to provide  Vitals:   Vitals:   01/09/2017 2252 01/08/17 0500 01/08/17 0552 01/08/17 0843  BP:  (!) 59/36 (!) 73/45 (!) 74/44  Pulse:  98 95 97  Resp:  18    Temp:  (!) 96.5 F (35.8 C)  98.4 F (36.9 C)  TempSrc:  Rectal  Axillary  SpO2:  99%  92%  Weight: 117 lb (53.1 kg)     Height: 6\' 2"  (1.88 m)       Wt Readings from Last 3 Encounters:  01/09/2017 117 lb (53.1 kg)     Intake/Output Summary (Last 24 hours) at 01/08/2017 1515 Last data filed at 01/08/2017 1448 Gross per 24 hour  Intake 26500 ml  Output 48200 ml  Net -21700 ml    Physical Exam:   GENERAL:  Critically ill appearing  HEAD, EYES, EARS, NOSE AND THROAT: Atraumatic, normocephalic.   Pupils equal and reactive to light. Sclerae anicteric. No conjunctival injection. No oro-pharyngeal erythema.  NECK: Supple. There is no jugular venous distention. No bruits, no lymphadenopathy, no thyromegaly.  HEART: Regular rate and rhythm,. No murmurs, no rubs, no clicks.  LUNGS: Clear to auscultation bilaterally. No rales or rhonchi. No wheezes.  ABDOMEN: Soft, flat, nontender, nondistended. Has good bowel sounds. No hepatosplenomegaly appreciated.  EXTREMITIES: No evidence of any cyanosis, clubbing, or peripheral edema.  +2 pedal and radial pulses bilaterally.  NEUROLOGIC: drowsy SKIN: Moist and warm with no rashes appreciated.  Psych: Not anxious, depressed LN: No  inguinal LN enlargement    Antibiotics   Anti-infectives (From admission, onward)   None      Medications   Scheduled Meds: . benzonatate  200 mg Oral TID  . docusate sodium  200 mg Oral BID  . finasteride  5 mg Oral Daily  . folic acid  1 mg Oral Daily  . oxybutynin  10 mg Oral BID   Continuous Infusions: PRN Meds:.acetaminophen **OR** acetaminophen, acetaminophen **OR** acetaminophen, antiseptic oral rinse, glycopyrrolate **OR** glycopyrrolate **OR** glycopyrrolate, haloperidol **OR** haloperidol **OR** haloperidol lactate, morphine injection, morphine CONCENTRATE **OR**  morphine CONCENTRATE, ondansetron **OR** ondansetron (ZOFRAN) IV, polyvinyl alcohol   Data Review:   Micro Results No results found for this or any previous visit (from the past 240 hour(s)).  Radiology Reports US Renal  Result Date: 01/08/2017 CLINICAL DATA:  Bladder obstruction EXAM: RENAL / URINARY TRACT ULTRASOUND COMPLETE COMPARISON:  PET-CT 07/31/2009 FINDINGS: Right Kidney: Length: 11.6 cm. Normal cortical thickness and echogenicity. Small cyst at mid upper RIGHT kidney 16 x 18 x 21 mm. Additional small cyst impaired foci at the central kidney favor mild collecting system dilatation over additional peripelvic cysts. No solid mass or shadowing calcification. Left Kidney: Length: 10.5 cm. Normal cortical thickness and echogenicity. Probable peripelvic cysts, largest 3.8 x 2.5 x 3.8 cm. No definite solid mass or hydronephrosis. No shadowing calcification. Bladder: Contains a Foley catheter. Heterogeneous material is seen within the bladder lumen with minimal urine. No blood flow within this collection on color Doppler imaging. This could represent debris, clot, or tumor. A portion of the intraluminal material his hyperechoic with posterior shadowing question bladder calculus 2.3 cm diameter. Prostate gland appears enlarged. IMPRESSION: Probable peripelvic cysts LEFT kidney with peripelvic cyst versus mild  collecting system dilatation at RIGHT kidney. Abnormal heterogeneous material within bladder lumen which could represent debris, hemorrhage, or tumor. Suspected bladder calculus and prostatic enlargement. Cystoscopic evaluation recommended to exclude neoplasm. Electronically Signed   By: Lavonia Dana M.D.   On: 01/08/2017 12:47   Dg Femur Min 2 Views Left  Result Date: 02/04/2017 CLINICAL DATA:  Left midshaft femur pain. EXAM: LEFT FEMUR 2 VIEWS COMPARISON:  None. FINDINGS: The femur appears osteopenic but intact and located. Possible left superior pubic ramus fracture, but area only covered on one view. No visible sclerotic bone lesion. Atherosclerotic calcification. IMPRESSION: 1. No evidence of fracture or metastasis in the left femur. 2. Possible nondisplaced left superior pubic ramus fracture, but area only covered on one view. Electronically Signed   By: Monte Fantasia M.D.   On: 01/22/2017 16:37     CBC Recent Labs  Lab 02/02/2017 1640 01/08/17 0609  WBC 4.6 3.4*  HGB 10.5* 8.0*  HCT 32.1* 25.1*  PLT 482* 405  MCV 95.3 96.8  MCH 31.3 30.7  MCHC 32.8 31.7*  RDW 20.8* 21.1*  LYMPHSABS 0.3*  --   MONOABS 0.1*  --   EOSABS 0.0  --   BASOSABS 0.1  --     Chemistries  Recent Labs  Lab 01/05/2017 1640  NA 128*  K 4.5  CL 97*  CO2 21*  GLUCOSE 178*  BUN 17  CREATININE 0.84  CALCIUM 8.6*   ------------------------------------------------------------------------------------------------------------------ estimated creatinine clearance is 53.6 mL/min (by C-G formula based on SCr of 0.84 mg/dL). ------------------------------------------------------------------------------------------------------------------ No results for input(s): HGBA1C in the last 72 hours. ------------------------------------------------------------------------------------------------------------------ No results for input(s): CHOL, HDL, LDLCALC, TRIG, CHOLHDL, LDLDIRECT in the last 72  hours. ------------------------------------------------------------------------------------------------------------------ No results for input(s): TSH, T4TOTAL, T3FREE, THYROIDAB in the last 72 hours.  Invalid input(s): FREET3 ------------------------------------------------------------------------------------------------------------------ No results for input(s): VITAMINB12, FOLATE, FERRITIN, TIBC, IRON, RETICCTPCT in the last 72 hours.  Coagulation profile Recent Labs  Lab 01/29/2017 1640  INR 1.47    No results for input(s): DDIMER in the last 72 hours.  Cardiac Enzymes No results for input(s): CKMB, TROPONINI, MYOGLOBIN in the last 168 hours.  Invalid input(s): CK ------------------------------------------------------------------------------------------------------------------ Invalid input(s): POCBNP    Assessment & Plan   82 year old male with past medical history of lung cancer, BPH with chronic indwelling Foley, history of previous DVT, rheumatoid  arthritis, depression who presents to the hospital due to abdominal pain and noted to have hematuria.  1. Urinary retention with hematuria-secondary to BPH and patient being on anticoagulants. Seen by urology.  Has CBI placed an ultrasound of the bladder Patient's daughter is at bedside I discussed with her she states that she does not want him to be uncomfortable and does not want any aggressive intervention They would like for him to be on a comfort care measures   2. Abdominal pain-secondary to the hematuria and urinary retention. Improved with pain control and CBI.  Continue pain control  3. History of previous DVT- discontinue  4. Depression-continue Remeron, Zoloft.  5. Anxiety-continue Ativan as needed.  6. BPH-continue finasteride, oxybutynin  I have discussed with the patient's daughter regarding comfort care measures we will initiate comfort care pathway       Code Status Orders  (From admission,  onward)        Start     Ordered   01/08/17 1514  Do not attempt resuscitation (DNR)  Continuous    Question Answer Comment  In the event of cardiac or respiratory ARREST Do not call a "code blue"   In the event of cardiac or respiratory ARREST Do not perform Intubation, CPR, defibrillation or ACLS   In the event of cardiac or respiratory ARREST Use medication by any route, position, wound care, and other measures to relive pain and suffering. May use oxygen, suction and manual treatment of airway obstruction as needed for comfort.      01/08/17 1514    Code Status History    Date Active Date Inactive Code Status Order ID Comments User Context   01/31/2017 21:15 01/08/2017 15:14 DNR [id number]  [name], MD Inpatient    Advance Directive Documentation     Most Recent Value  Type of Advance Directive  Healthcare Power of Attorney, Living will, Out of facility DNR (pink MOST or yellow form)  Pre-existing out of facility DNR order (yellow form or pink MOST form)  Yellow form placed in chart (order not valid for inpatient use)  "MOST" Form in Place?  No data           Consults urology   DVT Prophylaxis comfort care Lab Results  Component Value Date   PLT 405 01/08/2017     Time Spent in minutes   35 minutes greater than 50% of time spent in care coordination and counseling patient regarding the condition and plan of care.   Dustin Flock M.D on 01/08/2017 at 3:15 PM  Between 7am to 6pm - Pager - [phone]  After 6pm go to [email or web address] - password [email or web address] [facility]   Office  [phone]

## 2017-01-08 NOTE — Progress Notes (Signed)
Called by RN taking care of patient for low blood pressure and low body temperature Patient put on warming blanket 500 ml saline bolus and started on maintenance fluids Patient is DNR with history of lung cancer under hospice at home. Family do not want any aggressive measures Will continue iv fluids and supportive care. Discussed with RN taking care of patient.

## 2017-01-08 NOTE — Plan of Care (Signed)
  Progressing Education: Knowledge of General Education information will improve 01/08/2017 1459 - Progressing by Cherylann Parr, RN Health Behavior/Discharge Planning: Ability to manage health-related needs will improve 01/08/2017 1459 - Progressing by Cherylann Parr, RN Clinical Measurements: Ability to maintain clinical measurements within normal limits will improve 01/08/2017 1459 - Progressing by Cherylann Parr, RN Will remain free from infection 01/08/2017 1459 - Progressing by Cherylann Parr, RN Diagnostic test results will improve 01/08/2017 1459 - Progressing by Cherylann Parr, RN Respiratory complications will improve 01/08/2017 1459 - Progressing by Cherylann Parr, RN Cardiovascular complication will be avoided 01/08/2017 1459 - Progressing by Cherylann Parr, RN Activity: Risk for activity intolerance will decrease 01/08/2017 1459 - Progressing by Cherylann Parr, RN Nutrition: Adequate nutrition will be maintained 01/08/2017 1459 - Progressing by Cherylann Parr, RN Coping: Level of anxiety will decrease 01/08/2017 1459 - Progressing by Cherylann Parr, RN Elimination: Will not experience complications related to bowel motility 01/08/2017 1459 - Progressing by Cherylann Parr, RN Will not experience complications related to urinary retention 01/08/2017 1459 - Progressing by Cherylann Parr, RN Pain Managment: General experience of comfort will improve 01/08/2017 1459 - Progressing by Cherylann Parr, RN Safety: Ability to remain free from injury will improve 01/08/2017 1459 - Progressing by Cherylann Parr, RN Skin Integrity: Risk for impaired skin integrity will decrease 01/08/2017 1459 - Progressing by Cherylann Parr, RN Coping: Ability to identify and develop effective coping behavior will improve 01/08/2017 1459 - Progressing by Cherylann Parr, RN Pain Management: Satisfaction with pain management regimen will improve 01/08/2017 1459 - Progressing by Cherylann Parr, RN

## 2017-01-08 NOTE — Progress Notes (Addendum)
Vitals low.  Spoke with Dr Estanislado Pandy.  500 cc bolus.  Bear hugger. Dorna Bloom RN Pt has had a change in breathing with a few periods off apnea.  Called friend/DPOA Ginger and explained pt's vitals and change in condition. She is coming in to see pt. Dorna Bloom RN Decreased level of responsiveness.  Pt falls back to sleep quickly after waking. More periods of apnea, open mouth shallow breathing. Dorna Bloom RN

## 2017-01-08 NOTE — Care Management (Deleted)
Patient active with [facility]. Contact person is Lenox Ponds, [phone] if patient discharges.

## 2017-01-08 NOTE — Clinical Social Work Note (Addendum)
CSW received referral that patient is from [facility], patient not able to discharge back to hospice home per nurse liasion and MD, possible hospital death CSW to sign off.  Jones Broom. Robards, MSW, [place]  01/08/2017 3:32 PM

## 2017-01-08 NOTE — Progress Notes (Signed)
Several visits made and several conversations had with family, attending physician, [place] physician and urologist. Patient is currently receiving continuous bladder irrigation, drainage continues to be bloody. Ultrasound performed as ordered by Dr. Erlene Quan, attending physician discussed results with patient's daughter Ginger. Again after much discussion family has chosen for patient to remain at [facility]. Writer contacted the hospice home and the CBI is not able to be done there. Hospice medical director feels that patient needs the CBI for comfort as does family. Patient is not eating or drinking, his blood pressure was low this am and fluids were started., he has also required a warming blanket for a temperature of 96.5 rectal. Family feels that patient is dying and wishes for him to remains comfortable. IV fluids to be discontinued, no further procedures. Emotional support given to family. patient has remained minimally responsive through out the day. Hospice team updated.  Flo Shanks RN, BSN, [place] [facility], hospital Liaison [phone]

## 2017-01-08 NOTE — H&P (Signed)
Urology Consult  I have been asked to see the patient by Dr. Verdell Carmine, for evaluation and management of gross hematuria.  Chief Complaint: gross hematuria  History of Present Illness: Dustin Poole is a 82 y.o. year old with advanced lung cancer currently residing at hospice facility brought in yesterday with a nondraining Foley catheter after developing gross hematuria over the past 24 hours.  In the emergency room, the catheter was exchanged for a 20 [place] three-way hematuria catheter and he was started on CBI.   Presumably, bladder scan in the emergency room showed 300 cc in his bladder despite his Foley catheter.  Urology was called today for assistance.  Dustin Poole does have a chronic indwelling Foley catheter which has been in place for at least 1-1/2 years.  He was previously managed by urology at the [facility]  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  He has had issues with recurrent urinary tract infections over the past several months has recently completed a course of antibiotics prescribed by his PCP at the [place].  He has no personal history of gross hematuria.  He is currently on Eliquis for history of DVT.  His history today is provided primarily by his granddaughter who is his caretaker.   Past Medical History:  Diagnosis Date  . History of DVT (deep vein thrombosis)   . Lung cancer [facility])     History reviewed. No pertinent surgical history.  Home Medications:  Current Meds  Medication Sig  . Adalimumab (HUMIRA PEN) 40 MG/0.4ML PNKT Inject 0.4 mLs into the skin once a week.  Marland Kitchen albuterol (PROVENTIL) (2.5 MG/3ML) 0.083% nebulizer solution Take 2.5 mg by nebulization every 6 (six) hours as needed for wheezing or shortness of breath.  Marland Kitchen aspirin 81 MG tablet Take 81 mg by mouth daily.  . benzonatate (TESSALON) 200 MG capsule Take 1 capsule by mouth 3 (three)  times daily.  Marland Kitchen docusate sodium (COLACE) 100 MG capsule Take 200 mg by mouth 2 (two) times daily.  Marland Kitchen ELIQUIS 2.5 MG TABS tablet Take 2.5 mg by mouth 2 (two) times daily.  . finasteride (PROSCAR) 5 MG tablet Take 1 tablet by mouth daily.  . folic acid (FOLVITE) 1 MG tablet Take 1 mg by mouth daily.  Marland Kitchen guaiFENesin-codeine 100-10 MG/5ML syrup Take 10 mLs by mouth at bedtime.  . Lactulose 20 GM/30ML SOLN Take by mouth.  Marland Kitchen LORazepam (ATIVAN) 0.5 MG tablet Take 1-2 tablets by mouth every 6 (six) hours as needed.  . methotrexate 2.5 MG tablet Take 12.5 mg by mouth 2 (two) times a week.  . mirtazapine (REMERON) 7.5 MG tablet Take 7.5 mg by mouth at bedtime.  . MUCINEX 600 MG 12 hr tablet Take 1,200 mg by mouth 2 (two) times daily.  Marland Kitchen oxybutynin (DITROPAN-XL) 10 MG 24 hr tablet Take 10 mg by mouth 2 (two) times daily.  Marland Kitchen oxyCODONE-acetaminophen (PERCOCET/ROXICET) 5-325 MG tablet Take 1 tablet by mouth every 4 (four) hours as needed for severe pain.  Marland Kitchen senna-docusate (SENOKOT-S) 8.6-50 MG tablet Take 2 tablets by mouth 2 (two) times daily.  . sertraline (ZOLOFT) 100 MG tablet Take 100 mg by mouth daily.  . sorbitol 70 % solution Take 30 mLs by mouth daily.  . [DISCONTINUED] traZODone (DESYREL) 50 MG tablet Take by mouth.    Allergies: No Known Allergies  History reviewed. No pertinent family history.  Social History:  reports that he has quit smoking. His smoking use included cigarettes. He has a 110.00 pack-year smoking history. His smokeless tobacco use includes chew. He reports that he drinks alcohol. He reports that he does not use drugs.  ROS: A complete review of systems was performed.  All systems are negative except for pertinent findings as noted.  Physical Exam:  Vital signs in last 24 hours: Temp:  [96.5 F (35.8 C)-98.4 F (36.9 C)] 98.4 F (36.9 C) (01/04 0843) Pulse Rate:  [95-104] 97 (01/04 0843) Resp:  [18-30] 18 (01/04 0500) BP: (59-138)/(36-95) 74/44 (01/04 0843) SpO2:   [92 %-99 %] 92 % (01/04 0843) Weight:  [117 lb (53.1 kg)] 117 lb (53.1 kg) (01/03 2252) Constitutional: Alert but sleepy.  Not communicative.  Arousable.  Cachectic, ill-appearing.  Family at bedside. HEENT: [place] AT, moist mucus membranes.  Trachea midline, no masses Respiratory: Normal respiratory effort.  GI: Abdomen thin, bladder palpable despite Foley GU: Circumcised phallus with bilateral descended testicles.  Catheter in place draining maroon colored urine despite CBI. Skin: No rashes, bruises or suspicious lesions Neurologic: Grossly intact, no focal deficits, moving all 4 extremities Psychiatric: Blunted affect. MSK: Muscle waisting appreciated.     Laboratory Data:  Recent Labs    02/04/2017 1640 01/08/17 0609  WBC 4.6 3.4*  HGB 10.5* 8.0*  HCT 32.1* 25.1*   Recent Labs    01/17/2017 1640  NA 128*  K 4.5  CL 97*  CO2 21*  GLUCOSE 178*  BUN 17  CREATININE 0.84  CALCIUM 8.6*   Recent Labs    01/06/2017 1640  INR 1.47    Radiologic Imaging: Dg Femur Min 2 Views Left  Result Date: 01/05/2017 CLINICAL DATA:  Left midshaft femur pain. EXAM: LEFT FEMUR 2 VIEWS COMPARISON:  None. FINDINGS: The femur appears osteopenic but intact and located. Possible left superior pubic ramus fracture, but area only covered on one view. No visible sclerotic bone lesion. Atherosclerotic calcification. IMPRESSION: 1. No evidence of fracture or metastasis in the left femur. 2. Possible nondisplaced left superior pubic ramus fracture, but area only covered on one view. Electronically Signed   By: Monte Fantasia M.D.   On: 01/21/2017 16:37   UA reviewed, + RBC, otherwise negative  UCx pending  Procedure: Attempted to irrigate 20 [place] three-way catheter unsuccessfully, irrigation would go into the bladder but not able to aspirate back.  As such, this catheter was removed.  The patient was prepped in standard sterile fashion, G it was used per urethra.  A 24 [place] 3-way hematuria catheter was  advanced without difficulty.  The bladder was then irrigated with 2 L of saline resulting in approximately 100 cc of clot as well a good amount of additional fluid which was previously nondraining.  He was then started back on a moderate drip CBI which was light pink once the clot burden was had cleared.  Impression/Assessment:  82 year old male with severe gross hematuria of unknown etiology.  I discussed the differential diagnosis with his granddaughter including prostatic bleeding, upper tract bleeding, cystitis, Foley trauma, amongst others.  Catheter was exchanged for more appropriate hematuria catheter after clot burden evacuated from bladder.  Highly suspicious that there is a large amount of clot burden remaining which may be a well organized clot.  Plan for renal/ bladder ultrasound to assess this.  Lengthy conversation today both with palliative care team as well as the patient's family about how to proceed if his bleeding persists or if there  is significant clot burden.  Ultimately, the decision was made that he is not a surgical candidate for any reason thus clot evacuation in the operating room as well as fulguration is not planned.  He has had a significant drop in his hemoglobin over the past 12 hours as well as a generalized hypotension and hypothermia.  His overall prognosis is very poor.   Plan:  -We will continue CBI, hand irrigate as needed -Renal ultrasound obtained to assess overall residual clot burden and bladder -Continue to hold Eliquis -No plans for clot evacuation or fulguration in the operating room as per above discussion -Supportive care with fluids and blood transfusions as needed -Ultimate disposition to be determined by palliative care team hospice   Urology will continue to follow.  01/08/2017, 12:45 PM  Hollice Espy,  MD   I spent 80 min with this patient of which greater than 50% was spent in counseling and coordination of care with the patient.

## 2017-01-09 DIAGNOSIS — E871 Hypo-osmolality and hyponatremia: Secondary | ICD-10-CM | POA: Diagnosis not present

## 2017-01-09 DIAGNOSIS — R310 Gross hematuria: Secondary | ICD-10-CM | POA: Diagnosis not present

## 2017-01-09 DIAGNOSIS — N320 Bladder-neck obstruction: Secondary | ICD-10-CM | POA: Diagnosis not present

## 2017-01-09 LAB — URINE CULTURE

## 2017-01-09 NOTE — Progress Notes (Signed)
Urology Consult Follow Up  Subjective: Awake but not communicative.  CBI continues with light red urine on slow gtt.  Appears comfortable. Family has elected to transition to comfort care, IV fluids stopped.    Anti-infectives: Anti-infectives (From admission, onward)   None      Current Facility-Administered Medications  Medication Dose Route Frequency Provider Last Rate Last Dose  . acetaminophen (TYLENOL) tablet 650 mg  650 mg Oral Q6H PRN [name], MD       Or  . acetaminophen (TYLENOL) suppository 650 mg  650 mg Rectal Q6H PRN [name], MD      . acetaminophen (TYLENOL) tablet 650 mg  650 mg Oral Q6H PRN Dustin Flock, MD       Or  . acetaminophen (TYLENOL) suppository 650 mg  650 mg Rectal Q6H PRN Dustin Flock, MD      . antiseptic oral rinse (BIOTENE) solution 15 mL  15 mL Topical PRN Dustin Flock, MD      . benzonatate (TESSALON) capsule 200 mg  200 mg Oral TID [name], MD   200 mg at 01/09/17 0734  . docusate sodium (COLACE) capsule 200 mg  200 mg Oral BID [name], MD   200 mg at 01/09/17 0734  . finasteride (PROSCAR) tablet 5 mg  5 mg Oral Daily [name], MD   5 mg at 01/09/17 0734  . folic acid (FOLVITE) tablet 1 mg  1 mg Oral Daily [name], MD   1 mg at 01/09/17 0734  . glycopyrrolate (ROBINUL) tablet 1 mg  1 mg Oral Q4H PRN Dustin Flock, MD       Or  . glycopyrrolate (ROBINUL) injection 0.2 mg  0.2 mg Subcutaneous Q4H PRN Dustin Flock, MD       Or  . glycopyrrolate (ROBINUL) injection 0.2 mg  0.2 mg Intravenous Q4H PRN Dustin Flock, MD   0.2 mg at 01/08/17 1959  . haloperidol (HALDOL) tablet 0.5 mg  0.5 mg Oral Q4H PRN Dustin Flock, MD       Or  . haloperidol (HALDOL) 2 MG/ML solution 0.5 mg  0.5 mg Sublingual Q4H PRN Dustin Flock, MD       Or  . haloperidol lactate (HALDOL) injection 0.5 mg  0.5 mg Intravenous Q4H PRN Dustin Flock, MD      . HYDROmorphone (DILAUDID) injection 2 mg  2 mg  Intravenous Q1H PRN Dustin Flock, MD   2 mg at 01/09/17 0734  . morphine CONCENTRATE 10 MG/0.5ML oral solution 5 mg  5 mg Oral Q2H PRN Dustin Flock, MD       Or  . morphine CONCENTRATE 10 MG/0.5ML oral solution 5 mg  5 mg Sublingual Q2H PRN Dustin Flock, MD   5 mg at 01/09/17 0155  . ondansetron (ZOFRAN-ODT) disintegrating tablet 4 mg  4 mg Oral Q6H PRN Dustin Flock, MD       Or  . ondansetron (ZOFRAN) injection 4 mg  4 mg Intravenous Q6H PRN Dustin Flock, MD      . oxybutynin (DITROPAN-XL) 24 hr tablet 10 mg  10 mg Oral BID [name], MD   10 mg at 01/09/17 0736  . polyvinyl alcohol (LIQUIFILM TEARS) 1.4 % ophthalmic solution 1 drop  1 drop Both Eyes QID PRN Dustin Flock, MD         Objective: Vital signs in last 24 hours: Temp:  [98.4 F (36.9 C)-100.2 F (37.9 C)] 100.2 F (37.9 C) (01/05  6644) Pulse Rate:  [47-97] 47 (01/05 0622) BP: (74-83)/(36-44) 83/36 (01/05 0622) SpO2:  [76 %-92 %] 76 % (01/05 0622)  Intake/Output from previous day: 01/04 0701 - 01/05 0700 In: 15000  Out: 32200 [Urine:32200] Intake/Output this shift: No intake/output data recorded.   Physical Exam Constitutional: Awake but  not communicative.  Arousable.  Cachectic, ill-appearing.  HEENT: [place] AT, moist mucus membranes.  Trachea midline, no masses Respiratory: Normal respiratory effort.  GU:  Catheter in place draining light cranberry colored on slow gtt, no clots. Skin: No rashes, bruises or suspicious lesions Neurologic: Grossly intact, no focal deficits, moving all 4 extremities MSK: Muscle waisting appreciated.     Lab Results:  Recent Labs    01/06/2017 1640 01/08/17 0609  WBC 4.6 3.4*  HGB 10.5* 8.0*  HCT 32.1* 25.1*  PLT 482* 405   BMET Recent Labs    01/23/2017 1640  NA 128*  K 4.5  CL 97*  CO2 21*  GLUCOSE 178*  BUN 17  CREATININE 0.84  CALCIUM 8.6*   PT/INR Recent Labs    01/14/2017 1640  LABPROT 17.7*  INR 1.47    Studies/Results: US  Renal  Result Date: 01/08/2017 CLINICAL DATA:  Bladder obstruction EXAM: RENAL / URINARY TRACT ULTRASOUND COMPLETE COMPARISON:  PET-CT 07/31/2009 FINDINGS: Right Kidney: Length: 11.6 cm. Normal cortical thickness and echogenicity. Small cyst at mid upper RIGHT kidney 16 x 18 x 21 mm. Additional small cyst impaired foci at the central kidney favor mild collecting system dilatation over additional peripelvic cysts. No solid mass or shadowing calcification. Left Kidney: Length: 10.5 cm. Normal cortical thickness and echogenicity. Probable peripelvic cysts, largest 3.8 x 2.5 x 3.8 cm. No definite solid mass or hydronephrosis. No shadowing calcification. Bladder: Contains a Foley catheter. Heterogeneous material is seen within the bladder lumen with minimal urine. No blood flow within this collection on color Doppler imaging. This could represent debris, clot, or tumor. A portion of the intraluminal material his hyperechoic with posterior shadowing question bladder calculus 2.3 cm diameter. Prostate gland appears enlarged. IMPRESSION: Probable peripelvic cysts LEFT kidney with peripelvic cyst versus mild collecting system dilatation at RIGHT kidney. Abnormal heterogeneous material within bladder lumen which could represent debris, hemorrhage, or tumor. Suspected bladder calculus and prostatic enlargement. Cystoscopic evaluation recommended to exclude neoplasm. Electronically Signed   By: Lavonia Dana M.D.   On: 01/08/2017 12:47   Dg Femur Min 2 Views Left  Result Date: 02/02/2017 CLINICAL DATA:  Left midshaft femur pain. EXAM: LEFT FEMUR 2 VIEWS COMPARISON:  None. FINDINGS: The femur appears osteopenic but intact and located. Possible left superior pubic ramus fracture, but area only covered on one view. No visible sclerotic bone lesion. Atherosclerotic calcification. IMPRESSION: 1. No evidence of fracture or metastasis in the left femur. 2. Possible nondisplaced left superior pubic ramus fracture, but area only  covered on one view. Electronically Signed   By: Monte Fantasia M.D.   On: 01/25/2017 16:37     Assessment: 82 yo M on comfort care with gross hematuria on CBI.  Hematuria of unclear etiology.  RUS shows bladder mass vs. Clot vs. Median lobe of prostate.   Overall, clot burden reasonable.    Plan: -hand irrigated as needed -attempt to wean CBI as possible but continue CBI for comfort -B&O suppositories for bladder spasms  -no plan for additional intervention based on comfort care status    LOS: 0 days    Hollice Espy 01/09/2017

## 2017-01-09 NOTE — Progress Notes (Signed)
[place] at [facility]                                                                                                                                                                                  Patient Demographics   Dustin Poole, is a 82 y.o. male, DOB - [birth year], MOQ:[id number]  Admit date - 02/03/2017   Admitting Physician [name], MD  Outpatient Primary MD for the patient is [name], MD   LOS - 0  Subjective:   pt comfortable, continues to heamturia  Review of Systems:   Romeo to provide  Vitals:   Vitals:   01/08/17 0500 01/08/17 0552 01/08/17 0843 01/09/17 0622  BP: (!) 59/36 (!) 73/45 (!) 74/44 (!) 83/36  Pulse: 98 95 97 (!) 47  Resp: 18     Temp: (!) 96.5 F (35.8 C)  98.4 F (36.9 C) 100.2 F (37.9 C)  TempSrc: Rectal  Axillary Axillary  SpO2: 99%  92% (!) 76%  Weight:      Height:        Wt Readings from Last 3 Encounters:  01/15/2017 117 lb (53.1 kg)     Intake/Output Summary (Last 24 hours) at 01/09/2017 1343 Last data filed at 01/09/2017 1239 Gross per 24 hour  Intake 15900 ml  Output 26700 ml  Net -10800 ml    Physical Exam:   GENERAL:  Critically ill appearing  HEAD, EYES, EARS, NOSE AND THROAT: Atraumatic, normocephalic.   Pupils equal and reactive to light. Sclerae anicteric. No conjunctival injection. No oro-pharyngeal erythema.  NECK: Supple. There is no jugular venous distention. No bruits, no lymphadenopathy, no thyromegaly.  HEART: Regular rate and rhythm,. No murmurs, no rubs, no clicks.  LUNGS: Clear to auscultation bilaterally. No rales or rhonchi. No wheezes.  ABDOMEN: Soft, flat, nontender, nondistended. Has good bowel sounds. No hepatosplenomegaly appreciated.  EXTREMITIES: No evidence of any cyanosis, clubbing, or peripheral edema.  +2 pedal and radial pulses bilaterally.  NEUROLOGIC: drowsy SKIN: Moist and warm with no rashes appreciated.  Psych: Not anxious,  depressed LN: No inguinal LN enlargement    Antibiotics   Anti-infectives (From admission, onward)   None      Medications   Scheduled Meds: . benzonatate  200 mg Oral TID  . docusate sodium  200 mg Oral BID  . finasteride  5 mg Oral Daily  . folic acid  1 mg Oral Daily  . oxybutynin  10 mg Oral BID   Continuous Infusions: PRN Meds:.acetaminophen **OR** acetaminophen, acetaminophen **OR** acetaminophen, antiseptic oral rinse, glycopyrrolate **OR** glycopyrrolate **OR** glycopyrrolate, haloperidol **OR** haloperidol **OR** haloperidol lactate, HYDROmorphone (DILAUDID) injection, morphine CONCENTRATE **  OR** morphine CONCENTRATE, ondansetron **OR** ondansetron (ZOFRAN) IV, polyvinyl alcohol   Data Review:   Micro Results Recent Results (from the past 240 hour(s))  Urine culture     Status: Abnormal   Collection Time: 01/14/2017  6:31 PM  Result Value Ref Range Status   Specimen Description   Final    URINE, RANDOM Performed at [facility], [address] [place], [place] [address]    Special Requests   Final    NONE Performed at [facility], [address] [place], [place] [address]    Culture MULTIPLE SPECIES PRESENT, SUGGEST RECOLLECTION (A)  Final   Report Status 01/09/2017 FINAL  Final    Radiology Reports US Renal  Result Date: 01/08/2017 CLINICAL DATA:  Bladder obstruction EXAM: RENAL / URINARY TRACT ULTRASOUND COMPLETE COMPARISON:  PET-CT 07/31/2009 FINDINGS: Right Kidney: Length: 11.6 cm. Normal cortical thickness and echogenicity. Small cyst at mid upper RIGHT kidney 16 x 18 x 21 mm. Additional small cyst impaired foci at the central kidney favor mild collecting system dilatation over additional peripelvic cysts. No solid mass or shadowing calcification. Left Kidney: Length: 10.5 cm. Normal cortical thickness and echogenicity. Probable peripelvic cysts, largest 3.8 x 2.5 x 3.8 cm. No definite solid mass or hydronephrosis. No shadowing  calcification. Bladder: Contains a Foley catheter. Heterogeneous material is seen within the bladder lumen with minimal urine. No blood flow within this collection on color Doppler imaging. This could represent debris, clot, or tumor. A portion of the intraluminal material his hyperechoic with posterior shadowing question bladder calculus 2.3 cm diameter. Prostate gland appears enlarged. IMPRESSION: Probable peripelvic cysts LEFT kidney with peripelvic cyst versus mild collecting system dilatation at RIGHT kidney. Abnormal heterogeneous material within bladder lumen which could represent debris, hemorrhage, or tumor. Suspected bladder calculus and prostatic enlargement. Cystoscopic evaluation recommended to exclude neoplasm. Electronically Signed   By: Lavonia Dana M.D.   On: 01/08/2017 12:47   Dg Femur Min 2 Views Left  Result Date: 01/06/2017 CLINICAL DATA:  Left midshaft femur pain. EXAM: LEFT FEMUR 2 VIEWS COMPARISON:  None. FINDINGS: The femur appears osteopenic but intact and located. Possible left superior pubic ramus fracture, but area only covered on one view. No visible sclerotic bone lesion. Atherosclerotic calcification. IMPRESSION: 1. No evidence of fracture or metastasis in the left femur. 2. Possible nondisplaced left superior pubic ramus fracture, but area only covered on one view. Electronically Signed   By: Monte Fantasia M.D.   On: 01/17/2017 16:37     CBC Recent Labs  Lab 02/02/2017 1640 01/08/17 0609  WBC 4.6 3.4*  HGB 10.5* 8.0*  HCT 32.1* 25.1*  PLT 482* 405  MCV 95.3 96.8  MCH 31.3 30.7  MCHC 32.8 31.7*  RDW 20.8* 21.1*  LYMPHSABS 0.3*  --   MONOABS 0.1*  --   EOSABS 0.0  --   BASOSABS 0.1  --     Chemistries  Recent Labs  Lab 01/27/2017 1640  NA 128*  K 4.5  CL 97*  CO2 21*  GLUCOSE 178*  BUN 17  CREATININE 0.84  CALCIUM 8.6*   ------------------------------------------------------------------------------------------------------------------ estimated  creatinine clearance is 53.6 mL/min (by C-G formula based on SCr of 0.84 mg/dL). ------------------------------------------------------------------------------------------------------------------ No results for input(s): HGBA1C in the last 72 hours. ------------------------------------------------------------------------------------------------------------------ No results for input(s): CHOL, HDL, LDLCALC, TRIG, CHOLHDL, LDLDIRECT in the last 72 hours. ------------------------------------------------------------------------------------------------------------------ No results for input(s): TSH, T4TOTAL, T3FREE, THYROIDAB in the last 72 hours.  Invalid input(s): FREET3 ------------------------------------------------------------------------------------------------------------------ No results for  input(s): VITAMINB12, FOLATE, FERRITIN, TIBC, IRON, RETICCTPCT in the last 72 hours.  Coagulation profile Recent Labs  Lab 01/05/2017 1640  INR 1.47    No results for input(s): DDIMER in the last 72 hours.  Cardiac Enzymes No results for input(s): CKMB, TROPONINI, MYOGLOBIN in the last 168 hours.  Invalid input(s): CK ------------------------------------------------------------------------------------------------------------------ Invalid input(s): POCBNP    Assessment & Plan   82 year old male with past medical history of lung cancer, BPH with chronic indwelling Foley, history of previous DVT, rheumatoid arthritis, depression who presents to the hospital due to abdominal pain and noted to have hematuria.  1. Urinary retention with hematuria-secondary to BPH and patient being on anticoagulants. Seen by urology.  Has CBI placed continue irrigation Pt comfort care  2. Abdominal pain-secondary to the hematuria and urinary retention. Improved with pain control and CBI.  Continue pain control  3. History of previous DVT- discontinue eliquis  4. Depression-continue Remeron, Zoloft.  5.  Anxiety-continue Ativan as needed.  6. BPH-continue finasteride, oxybutynin  I have discussed with the patient's daughter regarding comfort care measures we will initiate comfort care pathway       Code Status Orders  (From admission, onward)        Start     Ordered   01/08/17 1514  Do not attempt resuscitation (DNR)  Continuous    Question Answer Comment  In the event of cardiac or respiratory ARREST Do not call a "code blue"   In the event of cardiac or respiratory ARREST Do not perform Intubation, CPR, defibrillation or ACLS   In the event of cardiac or respiratory ARREST Use medication by any route, position, wound care, and other measures to relive pain and suffering. May use oxygen, suction and manual treatment of airway obstruction as needed for comfort.      01/08/17 1514    Code Status History    Date Active Date Inactive Code Status Order ID Comments User Context   01/15/2017 21:15 01/08/2017 15:14 DNR [id number]  [name], MD Inpatient    Advance Directive Documentation     Most Recent Value  Type of Advance Directive  Healthcare Power of Attorney, Living will, Out of facility DNR (pink MOST or yellow form)  Pre-existing out of facility DNR order (yellow form or pink MOST form)  Yellow form placed in chart (order not valid for inpatient use)  "MOST" Form in Place?  No data           Consults urology   DVT Prophylaxis comfort care Lab Results  Component Value Date   PLT 405 01/08/2017     Time Spent in minutes   32 minutes greater than 50% of time spent in care coordination and counseling patient regarding the condition and plan of care.   Dustin Flock M.D on 01/09/2017 at 1:43 PM  Between 7am to 6pm - Pager - [phone]  After 6pm go to [email or web address] - password [email or web address] [place] [facility]   Office  [phone]

## 2017-01-09 NOTE — Plan of Care (Signed)
  Progressing Education: Knowledge of General Education information will improve 01/09/2017 1318 - Progressing by Cherylann Parr, RN Health Behavior/Discharge Planning: Ability to manage health-related needs will improve 01/09/2017 1318 - Progressing by Cherylann Parr, RN Clinical Measurements: Ability to maintain clinical measurements within normal limits will improve 01/09/2017 1318 - Progressing by Cherylann Parr, RN Will remain free from infection 01/09/2017 1318 - Progressing by Cherylann Parr, RN Diagnostic test results will improve 01/09/2017 1318 - Progressing by Cherylann Parr, RN Respiratory complications will improve 01/09/2017 1318 - Progressing by Cherylann Parr, RN Cardiovascular complication will be avoided 01/09/2017 1318 - Progressing by Cherylann Parr, RN Activity: Risk for activity intolerance will decrease 01/09/2017 1318 - Progressing by Cherylann Parr, RN Nutrition: Adequate nutrition will be maintained 01/09/2017 1318 - Progressing by Cherylann Parr, RN Coping: Level of anxiety will decrease 01/09/2017 1318 - Progressing by Cherylann Parr, RN Elimination: Will not experience complications related to bowel motility 01/09/2017 1318 - Progressing by Cherylann Parr, RN Will not experience complications related to urinary retention 01/09/2017 1318 - Progressing by Cherylann Parr, RN Pain Managment: General experience of comfort will improve 01/09/2017 1318 - Progressing by Cherylann Parr, RN Safety: Ability to remain free from injury will improve 01/09/2017 1318 - Progressing by Cherylann Parr, RN Skin Integrity: Risk for impaired skin integrity will decrease 01/09/2017 1318 - Progressing by Cherylann Parr, RN Coping: Ability to identify and develop effective coping behavior will improve 01/09/2017 1318 - Progressing by Cherylann Parr, RN Pain Management: Satisfaction with pain management regimen will improve 01/09/2017 1318 - Progressing by Cherylann Parr, RN

## 2017-01-10 DIAGNOSIS — Z681 Body mass index (BMI) 19 or less, adult: Secondary | ICD-10-CM | POA: Diagnosis not present

## 2017-01-10 DIAGNOSIS — T83091A Other mechanical complication of indwelling urethral catheter, initial encounter: Secondary | ICD-10-CM | POA: Diagnosis not present

## 2017-01-10 DIAGNOSIS — Z7901 Long term (current) use of anticoagulants: Secondary | ICD-10-CM | POA: Diagnosis not present

## 2017-01-10 DIAGNOSIS — Z87891 Personal history of nicotine dependence: Secondary | ICD-10-CM | POA: Diagnosis not present

## 2017-01-10 DIAGNOSIS — M069 Rheumatoid arthritis, unspecified: Secondary | ICD-10-CM | POA: Diagnosis present

## 2017-01-10 DIAGNOSIS — R338 Other retention of urine: Secondary | ICD-10-CM | POA: Diagnosis present

## 2017-01-10 DIAGNOSIS — R310 Gross hematuria: Secondary | ICD-10-CM | POA: Diagnosis not present

## 2017-01-10 DIAGNOSIS — F419 Anxiety disorder, unspecified: Secondary | ICD-10-CM | POA: Diagnosis present

## 2017-01-10 DIAGNOSIS — Z515 Encounter for palliative care: Secondary | ICD-10-CM | POA: Diagnosis not present

## 2017-01-10 DIAGNOSIS — L89152 Pressure ulcer of sacral region, stage 2: Secondary | ICD-10-CM | POA: Diagnosis present

## 2017-01-10 DIAGNOSIS — Z66 Do not resuscitate: Secondary | ICD-10-CM | POA: Diagnosis present

## 2017-01-10 DIAGNOSIS — F329 Major depressive disorder, single episode, unspecified: Secondary | ICD-10-CM | POA: Diagnosis present

## 2017-01-10 DIAGNOSIS — N320 Bladder-neck obstruction: Secondary | ICD-10-CM | POA: Diagnosis not present

## 2017-01-10 DIAGNOSIS — Z86718 Personal history of other venous thrombosis and embolism: Secondary | ICD-10-CM | POA: Diagnosis not present

## 2017-01-10 DIAGNOSIS — Z7982 Long term (current) use of aspirin: Secondary | ICD-10-CM | POA: Diagnosis not present

## 2017-01-10 DIAGNOSIS — C3490 Malignant neoplasm of unspecified part of unspecified bronchus or lung: Secondary | ICD-10-CM | POA: Diagnosis present

## 2017-01-10 DIAGNOSIS — E871 Hypo-osmolality and hyponatremia: Secondary | ICD-10-CM | POA: Diagnosis not present

## 2017-01-10 DIAGNOSIS — Z87440 Personal history of urinary (tract) infections: Secondary | ICD-10-CM | POA: Diagnosis not present

## 2017-01-10 DIAGNOSIS — N401 Enlarged prostate with lower urinary tract symptoms: Secondary | ICD-10-CM | POA: Diagnosis present

## 2017-01-10 DIAGNOSIS — Y738 Miscellaneous gastroenterology and urology devices associated with adverse incidents, not elsewhere classified: Secondary | ICD-10-CM | POA: Diagnosis present

## 2017-01-10 DIAGNOSIS — R64 Cachexia: Secondary | ICD-10-CM | POA: Diagnosis present

## 2017-01-10 MED ORDER — SODIUM CHLORIDE 0.9 % IV SOLN
2.0000 mg/h | INTRAVENOUS | Status: DC
  Administered 2017-01-10 – 2017-01-11 (×2): 2 mg/h via INTRAVENOUS
  Filled 2017-01-10 (×2): qty 5

## 2017-01-10 MED ORDER — MORPHINE SULFATE (CONCENTRATE) 10 MG/0.5ML PO SOLN
10.0000 mg | ORAL | Status: DC | PRN
  Administered 2017-01-10: 10 mg via SUBLINGUAL
  Filled 2017-01-10: qty 1

## 2017-01-10 MED ORDER — HYDROMORPHONE 1 MG/ML IV SOLN
INTRAVENOUS | Status: DC
  Filled 2017-01-10: qty 25

## 2017-01-10 NOTE — Progress Notes (Signed)
Urology Consult Follow Up  Subjective: Large amount of family at bedside.  Patient is awake but not responsive.  Somewhat agonal breathing.  Dilaudid drip for pain control.  Anti-infectives: Anti-infectives (From admission, onward)   None      Current Facility-Administered Medications  Medication Dose Route Frequency Provider Last Rate Last Dose  . acetaminophen (TYLENOL) tablet 650 mg  650 mg Oral Q6H PRN [name], MD       Or  . acetaminophen (TYLENOL) suppository 650 mg  650 mg Rectal Q6H PRN [name], MD      . acetaminophen (TYLENOL) tablet 650 mg  650 mg Oral Q6H PRN Dustin Flock, MD       Or  . acetaminophen (TYLENOL) suppository 650 mg  650 mg Rectal Q6H PRN Dustin Flock, MD      . antiseptic oral rinse (BIOTENE) solution 15 mL  15 mL Topical PRN Dustin Flock, MD      . benzonatate (TESSALON) capsule 200 mg  200 mg Oral TID [name], MD   200 mg at 01/09/17 0734  . docusate sodium (COLACE) capsule 200 mg  200 mg Oral BID [name], MD   200 mg at 01/09/17 0734  . finasteride (PROSCAR) tablet 5 mg  5 mg Oral Daily [name], MD   5 mg at 01/09/17 0734  . folic acid (FOLVITE) tablet 1 mg  1 mg Oral Daily [name], MD   1 mg at 01/09/17 0734  . glycopyrrolate (ROBINUL) tablet 1 mg  1 mg Oral Q4H PRN Dustin Flock, MD       Or  . glycopyrrolate (ROBINUL) injection 0.2 mg  0.2 mg Subcutaneous Q4H PRN Dustin Flock, MD       Or  . glycopyrrolate (ROBINUL) injection 0.2 mg  0.2 mg Intravenous Q4H PRN Dustin Flock, MD   0.2 mg at 01/09/17 1252  . haloperidol (HALDOL) tablet 0.5 mg  0.5 mg Oral Q4H PRN Dustin Flock, MD       Or  . haloperidol (HALDOL) 2 MG/ML solution 0.5 mg  0.5 mg Sublingual Q4H PRN Dustin Flock, MD   0.5 mg at 01/10/17 0152   Or  . haloperidol lactate (HALDOL) injection 0.5 mg  0.5 mg Intravenous Q4H PRN Dustin Flock, MD   0.5 mg at 01/10/17 1428  . HYDROmorphone (DILAUDID) 50 mg in sodium  chloride 0.9 % 100 mL (0.5 mg/mL) infusion  2 mg/hr Intravenous Continuous Dustin Flock, MD 4 mL/hr at 01/10/17 1217 2 mg/hr at 01/10/17 1217  . HYDROmorphone (DILAUDID) injection 2 mg  2 mg Intravenous Q1H PRN Dustin Flock, MD   2 mg at 01/10/17 1123  . morphine CONCENTRATE 10 MG/0.5ML oral solution 10 mg  10 mg Sublingual Q2H PRN Amelia Jo, MD   10 mg at 01/10/17 0146  . ondansetron (ZOFRAN-ODT) disintegrating tablet 4 mg  4 mg Oral Q6H PRN Dustin Flock, MD       Or  . ondansetron (ZOFRAN) injection 4 mg  4 mg Intravenous Q6H PRN Dustin Flock, MD      . oxybutynin (DITROPAN-XL) 24 hr tablet 10 mg  10 mg Oral BID [name], MD   10 mg at 01/10/17 0104  . polyvinyl alcohol (LIQUIFILM TEARS) 1.4 % ophthalmic solution 1 drop  1 drop Both Eyes QID PRN Dustin Flock, MD         Objective: Vital signs in last 24 hours: Temp:  [98.8 F (37.1 C)] 98.8  F (37.1 C) (01/06 0511) Pulse Rate:  [103] 103 (01/06 0511) Resp:  [22] 22 (01/06 0511) BP: (82)/(44) 82/44 (01/06 0511) SpO2:  [86 %] 86 % (01/06 0511) Weight:  [117 lb 1 oz (53.1 kg)] 117 lb 1 oz (53.1 kg) (01/06 0300)  Intake/Output from previous day: 01/05 0701 - 01/06 0700 In: 900 [P.O.:900] Out: 17700 [Urine:17700] Intake/Output this shift: Total I/O In: 12000 [Other:12000] Out: 9000 [Urine:9000]   Physical Exam Constitutional: Awake but  not communicative.  Arousable.  Cachectic, ill-appearing.  Respiratory: Agonal breathing. GI: Abdomen soft, nontender, bladder nonpalpable. GU:  Catheter in place draining light cranberry colored on slow gtt, small scant clots. Skin: No rashes, bruises or suspicious lesions Neurologic: Grossly intact, no focal deficits, moving all 4 extremities MSK: Muscle waisting appreciated.     Lab Results:  Recent Labs    02/03/2017 1640 01/08/17 0609  WBC 4.6 3.4*  HGB 10.5* 8.0*  HCT 32.1* 25.1*  PLT 482* 405   BMET Recent Labs    01/11/2017 1640  NA 128*  K 4.5  CL  97*  CO2 21*  GLUCOSE 178*  BUN 17  CREATININE 0.84  CALCIUM 8.6*   PT/INR Recent Labs    01/27/2017 1640  LABPROT 17.7*  INR 1.47    Studies/Results: No results found.   Assessment: 82 yo M on comfort care with gross hematuria on CBI.  Hematuria of unclear etiology.  RUS shows bladder mass vs. Clot vs. Median lobe of prostate.  Patient now on comfort care with Dilaudid drip.  Family at bedside.  Appears comfortable at this time.  Plan: -hand irrigated as needed -attempt to wean CBI as possible but continue CBI for comfort to keep catheter from clotting off -B&O suppositories for bladder spasms  -no plan for additional intervention based on comfort care status    LOS: 0 days    Hollice Espy 01/10/2017

## 2017-01-10 NOTE — Progress Notes (Signed)
[place] at [facility]                                                                                                                                                                                  Patient Demographics   Dustin Poole, is a 82 y.o. male, DOB - Jan 31, 1935, JXB:[id number]  Admit date - 01/29/2017   Admitting Physician [name], MD  Outpatient Primary MD for the patient is [name], MD   LOS - 0  Subjective:  Patient last night was very uncomfortable and was in pain  Review of Systems:   [place] to provide  Vitals:   Vitals:   01/08/17 0843 01/09/17 0622 01/10/17 0300 01/10/17 0511  BP: (!) 74/44 (!) 83/36  (!) 82/44  Pulse: 97 (!) 47  (!) 103  Resp:    (!) 22  Temp: 98.4 F (36.9 C) 100.2 F (37.9 C)  98.8 F (37.1 C)  TempSrc: Axillary Axillary  Oral  SpO2: 92% (!) 76%  (!) 86%  Weight:   117 lb 1 oz (53.1 kg)   Height:        Wt Readings from Last 3 Encounters:  01/10/17 117 lb 1 oz (53.1 kg)     Intake/Output Summary (Last 24 hours) at 01/10/2017 1228 Last data filed at 01/10/2017 1132 Gross per 24 hour  Intake 6000 ml  Output 20200 ml  Net -14200 ml    Physical Exam:   GENERAL:  Critically ill appearing  HEAD, EYES, EARS, NOSE AND THROAT: Atraumatic, normocephalic.   Pupils equal and reactive to light. Sclerae anicteric. No conjunctival injection. No oro-pharyngeal erythema.  NECK: Supple. There is no jugular venous distention. No bruits, no lymphadenopathy, no thyromegaly.  HEART: Regular rate and rhythm,. No murmurs, no rubs, no clicks.  LUNGS: Clear to auscultation bilaterally. No rales or rhonchi. No wheezes.  ABDOMEN: Soft, flat, nontender, nondistended. Has good bowel sounds. No hepatosplenomegaly appreciated.  EXTREMITIES: No evidence of any cyanosis, clubbing, or peripheral edema.  +2 pedal and radial pulses bilaterally.  NEUROLOGIC: drowsy SKIN: Moist and warm with no rashes  appreciated.  Psych: Not anxious, depressed LN: No inguinal LN enlargement    Antibiotics   Anti-infectives (From admission, onward)   None      Medications   Scheduled Meds: . benzonatate  200 mg Oral TID  . docusate sodium  200 mg Oral BID  . finasteride  5 mg Oral Daily  . folic acid  1 mg Oral Daily  . oxybutynin  10 mg Oral BID   Continuous Infusions: . HYDROmorphone 2 mg/hr (01/10/17 1217)   PRN Meds:.acetaminophen **OR** acetaminophen, acetaminophen **OR**  acetaminophen, antiseptic oral rinse, glycopyrrolate **OR** glycopyrrolate **OR** glycopyrrolate, haloperidol **OR** haloperidol **OR** haloperidol lactate, HYDROmorphone (DILAUDID) injection, morphine CONCENTRATE, ondansetron **OR** ondansetron (ZOFRAN) IV, polyvinyl alcohol   Data Review:   Micro Results Recent Results (from the past 240 hour(s))  Urine culture     Status: Abnormal   Collection Time: 01/06/2017  6:31 PM  Result Value Ref Range Status   Specimen Description   Final    URINE, RANDOM Performed at [facility], [address], [place], [place] [address]    Special Requests   Final    NONE Performed at [facility], [address], [place], [place] [address]    Culture MULTIPLE SPECIES PRESENT, SUGGEST RECOLLECTION (A)  Final   Report Status 01/09/2017 FINAL  Final    Radiology Reports US Renal  Result Date: 01/08/2017 CLINICAL DATA:  Bladder obstruction EXAM: RENAL / URINARY TRACT ULTRASOUND COMPLETE COMPARISON:  PET-CT 07/31/2009 FINDINGS: Right Kidney: Length: 11.6 cm. Normal cortical thickness and echogenicity. Small cyst at mid upper RIGHT kidney 16 x 18 x 21 mm. Additional small cyst impaired foci at the central kidney favor mild collecting system dilatation over additional peripelvic cysts. No solid mass or shadowing calcification. Left Kidney: Length: 10.5 cm. Normal cortical thickness and echogenicity. Probable peripelvic cysts, largest 3.8 x 2.5 x 3.8 cm. No definite  solid mass or hydronephrosis. No shadowing calcification. Bladder: Contains a Foley catheter. Heterogeneous material is seen within the bladder lumen with minimal urine. No blood flow within this collection on color Doppler imaging. This could represent debris, clot, or tumor. A portion of the intraluminal material his hyperechoic with posterior shadowing question bladder calculus 2.3 cm diameter. Prostate gland appears enlarged. IMPRESSION: Probable peripelvic cysts LEFT kidney with peripelvic cyst versus mild collecting system dilatation at RIGHT kidney. Abnormal heterogeneous material within bladder lumen which could represent debris, hemorrhage, or tumor. Suspected bladder calculus and prostatic enlargement. Cystoscopic evaluation recommended to exclude neoplasm. Electronically Signed   By: Lavonia Dana M.D.   On: 01/08/2017 12:47   Dg Femur Min 2 Views Left  Result Date: 01/19/2017 CLINICAL DATA:  Left midshaft femur pain. EXAM: LEFT FEMUR 2 VIEWS COMPARISON:  None. FINDINGS: The femur appears osteopenic but intact and located. Possible left superior pubic ramus fracture, but area only covered on one view. No visible sclerotic bone lesion. Atherosclerotic calcification. IMPRESSION: 1. No evidence of fracture or metastasis in the left femur. 2. Possible nondisplaced left superior pubic ramus fracture, but area only covered on one view. Electronically Signed   By: Monte Fantasia M.D.   On: 01/13/2017 16:37     CBC Recent Labs  Lab 02/04/2017 1640 01/08/17 0609  WBC 4.6 3.4*  HGB 10.5* 8.0*  HCT 32.1* 25.1*  PLT 482* 405  MCV 95.3 96.8  MCH 31.3 30.7  MCHC 32.8 31.7*  RDW 20.8* 21.1*  LYMPHSABS 0.3*  --   MONOABS 0.1*  --   EOSABS 0.0  --   BASOSABS 0.1  --     Chemistries  Recent Labs  Lab 01/21/2017 1640  NA 128*  K 4.5  CL 97*  CO2 21*  GLUCOSE 178*  BUN 17  CREATININE 0.84  CALCIUM 8.6*    ------------------------------------------------------------------------------------------------------------------ estimated creatinine clearance is 53.6 mL/min (by C-G formula based on SCr of 0.84 mg/dL). ------------------------------------------------------------------------------------------------------------------ No results for input(s): HGBA1C in the last 72 hours. ------------------------------------------------------------------------------------------------------------------ No results for input(s): CHOL, HDL, LDLCALC, TRIG, CHOLHDL, LDLDIRECT in the last 72 hours. ------------------------------------------------------------------------------------------------------------------ No results for input(s):  TSH, T4TOTAL, T3FREE, THYROIDAB in the last 72 hours.  Invalid input(s): FREET3 ------------------------------------------------------------------------------------------------------------------ No results for input(s): VITAMINB12, FOLATE, FERRITIN, TIBC, IRON, RETICCTPCT in the last 72 hours.  Coagulation profile Recent Labs  Lab 02/01/2017 1640  INR 1.47    No results for input(s): DDIMER in the last 72 hours.  Cardiac Enzymes No results for input(s): CKMB, TROPONINI, MYOGLOBIN in the last 168 hours.  Invalid input(s): CK ------------------------------------------------------------------------------------------------------------------ Invalid input(s): POCBNP    Assessment & Plan   82 year old male with past medical history of lung cancer, BPH with chronic indwelling Foley, history of previous DVT, rheumatoid arthritis, depression who presents to the hospital due to abdominal pain and noted to have hematuria.  1. Urinary retention with hematuria-secondary to BPH and patient being on anticoagulants. Seen by urology.  Has CBI placed continue irrigation Pt comfort care  2. Abdominal pain-secondary to the hematuria and urinary retention.  Patient's pain still not  under control I have discussed with the daughter we will start patient on a Dilaudid continuous drip, will adjust the rate to control his pain  3. History of previous DVT- discontinue eliquis  4. Depression-continue Remeron, Zoloft.  5. Anxiety-continue Ativan as needed.  6. BPH-continue finasteride, oxybutynin       Code Status Orders  (From admission, onward)        Start     Ordered   01/08/17 1514  Do not attempt resuscitation (DNR)  Continuous    Question Answer Comment  In the event of cardiac or respiratory ARREST Do not call a "code blue"   In the event of cardiac or respiratory ARREST Do not perform Intubation, CPR, defibrillation or ACLS   In the event of cardiac or respiratory ARREST Use medication by any route, position, wound care, and other measures to relive pain and suffering. May use oxygen, suction and manual treatment of airway obstruction as needed for comfort.      01/08/17 1514    Code Status History    Date Active Date Inactive Code Status Order ID Comments User Context   02/04/2017 21:15 01/08/2017 15:14 DNR [id number]  [name], MD Inpatient    Advance Directive Documentation     Most Recent Value  Type of Advance Directive  Healthcare Power of Attorney, Living will, Out of facility DNR (pink MOST or yellow form)  Pre-existing out of facility DNR order (yellow form or pink MOST form)  Yellow form placed in chart (order not valid for inpatient use)  "MOST" Form in Place?  No data           Consults urology   DVT Prophylaxis comfort care Lab Results  Component Value Date   PLT 405 01/08/2017     Time Spent in minutes   47minutes greater than 50% of time spent in care coordination and counseling patient regarding the condition and plan of care.   Dustin Flock M.D on 01/10/2017 at 12:28 PM  Between 7am to 6pm - Pager - [phone]  After 6pm go to [email or web address] - password [email or web address] [facility]   Office   [phone]

## 2017-01-10 NOTE — Progress Notes (Signed)
Advanced care plan.  Purpose of the Encounter: To discuss goal of care Parties in Attendance: Patient himself, his daughter who has a healthcare power of attorney  Patient's Decision Capacity: Not intact  Subjective/Patient's story: Patient is a 82 year old male with known history of lung cancer.  History of DVT rheumatoid arthritis who was actually in hospice home for respite care.  He was brought to the hospital with hematuria.     Objective/Medical story I discuss with the daughter regarding overall poor prognosis and further treatment plans, she has agreed to pursue comfort care measures with pain control and anxiety control   Goals of care determination:  Patient will be switched to comfort care   CODE STATUS: DNR   Time spent discussing advanced care planning:  16 minutes

## 2017-01-10 NOTE — Progress Notes (Signed)
Nutrition Brief Note  Patient identified to be seen for malnutrition screening tool. Chart reviewed. Patient has transitioned to comfort care.   No nutrition interventions warranted at this time.  Please consult RD as needed.   Willey Blade, MS, [place], LDN Office: [phone] Pager: [phone] After Hours/Weekend Pager: [phone]

## 2017-01-10 NOTE — Plan of Care (Signed)
  Pain Managment: General experience of comfort will improve 01/10/2017 1640 - Progressing by Oris Drone, RN  Pt [place]; started on IV Dilaudid drip this shift with better pain control Safety: Ability to remain free from injury will improve 01/10/2017 1640 - Progressing by Oris Drone, RN  Pt placed on Low Fall Risks this shift due to pt not moving in bed Pain Management: Satisfaction with pain management regimen will improve 01/10/2017 1640 - Progressing by Oris Drone, RN Pt started in IV Dilaudid continuously this shift

## 2017-01-11 DIAGNOSIS — Z515 Encounter for palliative care: Secondary | ICD-10-CM

## 2017-01-11 DIAGNOSIS — E871 Hypo-osmolality and hyponatremia: Secondary | ICD-10-CM

## 2017-01-11 DIAGNOSIS — L8990 Pressure ulcer of unspecified site, unspecified stage: Secondary | ICD-10-CM

## 2017-01-11 DIAGNOSIS — N320 Bladder-neck obstruction: Secondary | ICD-10-CM

## 2017-01-11 DIAGNOSIS — T83091A Other mechanical complication of indwelling urethral catheter, initial encounter: Principal | ICD-10-CM

## 2017-01-11 DIAGNOSIS — R310 Gross hematuria: Secondary | ICD-10-CM

## 2017-01-11 NOTE — Progress Notes (Signed)
Family Meeting Note  Advance Directive:yes  Today a meeting took place with the Daughter at bedside.  Patient is unable to participate due QQ:PYPPJK capacity Comatose   The following clinical team members were present during this meeting:MD  The following were discussed:Patient's diagnosis:   82 y.o. male with a known history of lung cancer, previous history of DVT, rheumatoid arthritis, BPH status post chronic indwelling Foley, who presents to the hospital from home due to abdominal pain and having urinary retention. Patient is followed by hospice services at home due to his lung cancer.  , Patient's progosis: < 2 weeks and Goals for treatment: DNR  Additional follow-up to be provided: Comfort care, He is actively dying.  Time spent during discussion:20 minutes  Max Sane, MD

## 2017-01-11 NOTE — Consult Note (Signed)
Consultation Note Date: 01/11/2017   Patient Name: Dustin Poole  DOB: 19-Nov-1935  MRN: [id number]  Age / Sex: 82 y.o., male  PCP: [name], MD Referring Physician: Max Sane, MD  Reason for Consultation: Establishing goals of care and Terminal Care  HPI/Patient Profile: Dustin Poole  is a 82 y.o. male with a known history of lung cancer, previous history of DVT, rheumatoid arthritis, BPH status post chronic indwelling Foley, who presents to the hospital from hospice home due to abdominal pain and having urinary retention. Patient is followed by hospice services due to his lung cancer.    Clinical Assessment and Goals of Care: Mr. Aird is resting in bed, Ginger daughter and nephew at bedside. Family member states he has lung cancer, and had fell and has pelvic fractures with a NWB status to lower extremities. She states he had been home with hospice and was moved to hospice facility for respite and the foley clogged. Currently with CBI in place per urology. Family member states he told her several days ago he was going to die, that he couldn't wait to go be with Jesus and his other family members who have already passed.   End of life medications in place per primary team.     SUMMARY OF RECOMMENDATIONS   Patient appears comfortable. No changes to medications at this time.    Code Status/Advance Care Planning:  DNR    Symptom Management:   Per primary team, agree with medications, no change.   Palliative Prophylaxis:   Oral Care  Additional Recommendations (Limitations, Scope, Preferences):  Full Comfort Care   Prognosis:   Hours - Days  Discharge Planning: Anticipated Hospital Death      Primary Diagnoses: Present on Admission: . Hematuria   I have reviewed the medical record, interviewed the patient and family, and examined the patient. The following aspects are  pertinent.  Past Medical History:  Diagnosis Date  . History of DVT (deep vein thrombosis)   . Lung cancer [facility])    Social History   Socioeconomic History  . Marital status: Widowed    Spouse name: None  . Number of children: None  . Years of education: None  . Highest education level: None  Social Needs  . Financial resource strain: Not very hard  . Food insecurity - worry: Patient refused  . Food insecurity - inability: Patient refused  . Transportation needs - medical: Patient refused  . Transportation needs - non-medical: Patient refused  Occupational History  . None  Tobacco Use  . Smoking status: Former Smoker    Packs/day: 2.00    Years: 55.00    Pack years: 110.00    Types: Cigarettes  . Smokeless tobacco: Current User    Types: Chew  Substance and Sexual Activity  . Alcohol use: Yes    Comment: occasional  . Drug use: No  . Sexual activity: No  Other Topics Concern  . None  Social History Narrative  . None   History reviewed. No  pertinent family history. Scheduled Meds: . oxybutynin  10 mg Oral BID   Continuous Infusions: . HYDROmorphone 2 mg/hr (01/10/17 1217)   PRN Meds:.acetaminophen **OR** acetaminophen, acetaminophen **OR** acetaminophen, glycopyrrolate **OR** glycopyrrolate **OR** glycopyrrolate, haloperidol **OR** haloperidol **OR** haloperidol lactate, HYDROmorphone (DILAUDID) injection, morphine CONCENTRATE, ondansetron **OR** ondansetron (ZOFRAN) IV Medications Prior to Admission:  Prior to Admission medications   Medication Sig Start Date End Date Taking? Authorizing Provider  Adalimumab (HUMIRA PEN) 40 MG/0.4ML PNKT Inject 0.4 mLs into the skin once a week.   Yes [provider]  albuterol (PROVENTIL) (2.5 MG/3ML) 0.083% nebulizer solution Take 2.5 mg by nebulization every 6 (six) hours as needed for wheezing or shortness of breath.   Yes [provider]  aspirin 81 MG tablet Take 81 mg by mouth daily.   Yes [provider]  benzonatate (TESSALON) 200 MG capsule Take 1 capsule by mouth 3 (three) times daily. 12/18/16  Yes [provider]  docusate sodium (COLACE) 100 MG capsule Take 200 mg by mouth 2 (two) times daily. 12/25/16  Yes [provider]  ELIQUIS 2.5 MG TABS tablet Take 2.5 mg by mouth 2 (two) times daily. 12/30/16  Yes [provider]  finasteride (PROSCAR) 5 MG tablet Take 1 tablet by mouth daily. 12/24/16  Yes [provider]  folic acid (FOLVITE) 1 MG tablet Take 1 mg by mouth daily.   Yes [provider]  guaiFENesin-codeine 100-10 MG/5ML syrup Take 10 mLs by mouth at bedtime. 12/12/16  Yes [provider]  Lactulose 20 GM/30ML SOLN Take by mouth.   Yes [provider]  LORazepam (ATIVAN) 0.5 MG tablet Take 1-2 tablets by mouth every 6 (six) hours as needed. 12/24/16  Yes [provider]  methotrexate 2.5 MG tablet Take 12.5 mg by mouth 2 (two) times a week.   Yes [provider]  mirtazapine (REMERON) 7.5 MG tablet Take 7.5 mg by mouth at bedtime.   Yes [provider]  MUCINEX 600 MG 12 hr tablet Take 1,200 mg by mouth 2 (two) times daily. 01/01/17  Yes [provider]  oxybutynin (DITROPAN-XL) 10 MG 24 hr tablet Take 10 mg by mouth 2 (two) times daily. 12/23/16  Yes [provider]  oxyCODONE-acetaminophen (PERCOCET/ROXICET) 5-325 MG tablet Take 1 tablet by mouth every 4 (four) hours as needed for severe pain.   Yes [provider]  senna-docusate (SENOKOT-S) 8.6-50 MG tablet Take 2 tablets by mouth 2 (two) times daily.   Yes [provider]  sertraline (ZOLOFT) 100 MG tablet Take 100 mg by mouth daily. 12/24/16  Yes [provider]  sorbitol 70 % solution Take 30 mLs by mouth daily.   Yes [provider]  ondansetron (ZOFRAN-ODT) 4 MG disintegrating tablet Take 4 mg by mouth 2 (two) times daily.    [provider]   No Known  Allergies Review of Systems  Unable to perform ROS   Physical Exam  Constitutional: No distress.  Eyes:  Eyes half open.   Pulmonary/Chest:  Unlabored, uneven.   Skin: Skin is warm and dry.    Vital Signs: BP (!) 79/52 (BP Location: Right Arm)   Pulse (!) 105   Temp 98.6 F (37 C) (Oral)   Resp 14   Ht 6\' 2"  (1.88 m)   Wt 53.1 kg (117 lb 1 oz)   SpO2 (!) 77%   BMI 15.03 kg/m  Pain Assessment: PAINAD(Dilaudid IV drip started per MD order in [facility])   Pain  Score: Asleep   SpO2: SpO2: (!) 77 % O2 Device:SpO2: (!) 77 % O2 Flow Rate: .   IO: Intake/output summary:   Intake/Output Summary (Last 24 hours) at 01/11/2017 1048 Last data filed at 01/11/2017 1000 Gross per 24 hour  Intake 12059.13 ml  Output 21600 ml  Net -9540.87 ml    LBM: Last BM Date: 01/10/2017 Baseline Weight: Weight: 53.1 kg (117 lb) Most recent weight: Weight: 53.1 kg (117 lb 1 oz)     Palliative Assessment/Data: 10%     Time In: 10:20 Time Out: 10:55 Time Total: 35 min Greater than 50%  of this time was spent counseling and coordinating care related to the above assessment and plan.  Signed by: Asencion Gowda, NP   Please contact Palliative Medicine Team phone at [phone] for questions and concerns.  For individual provider: See Shea Evans

## 2017-01-11 NOTE — Progress Notes (Signed)
Visit made. Patient seen sitting up in bed, eyes open, unresponsive. Daughter Ginger and brother Richardson Landry at bedside. Continuous dilaudid drip started on 1/6 for comfort. Patient also required a PRN dose of haldol early this morning. Patient is actively dying, family aware and supported. Respirations 5 / minute, skin warm, but discolored.Hospital death expected. Will continue to follow and provide support and update hospice team. Flo Shanks RN, BSN, [place] [facility] [place], hospital Liaison [phone]

## 2017-01-11 NOTE — Plan of Care (Signed)
Pt mostly unresponsive throughout shift. No signs/symptoms of discomfort. Dilaudid gtt continues at 1ml/ hr. CBI continued. Family at bedside. Emotional support provided to family.

## 2017-01-11 NOTE — Progress Notes (Signed)
Patient seem to be actively dying. Support provided to family at bedside.   Meds adjusted to keep him comfortable. D/w RN and Palliative care and Hospice Liaison.  Time spent: 25 mins

## 2017-02-05 NOTE — Death Summary Note (Signed)
DEATH SUMMARY   Patient Details  Name: Dustin Poole MRN: [id number] DOB: [birth year]  Admission/Discharge Information   Admit Date:  02/06/17  Date of Death: Date of Death: February 11, 2017  Time of Death: Time of Death: 0110  Length of Stay: 2  Referring Physician: [name], MD   Reason(s) for Hospitalization  Hematuria  Diagnoses  Preliminary cause of death: Lung cancer Secondary Diagnoses (including complications and co-morbidities):  Active Problems:   Hematuria   Pressure injury of skin   Brief Hospital Course (including significant findings, care, treatment, and services provided and events leading to death)  Dustin Poole is a 82 y.o. year old male with a known history of lung cancer, previous history of DVT, rheumatoid arthritis, BPH status post chronic indwelling Foley, admitted to the hospital from hospice home due to abdominal pain and having urinary retention. Patient is followed by hospice services due to his lung cancer.  He was started on CBI per urology. He was on Dilaudid drip and passed away peacefully. Pertinent Labs and Studies  Significant Diagnostic Studies US Renal  Result Date: 01/08/2017 CLINICAL DATA:  Bladder obstruction EXAM: RENAL / URINARY TRACT ULTRASOUND COMPLETE COMPARISON:  PET-CT 07/31/2009 FINDINGS: Right Kidney: Length: 11.6 cm. Normal cortical thickness and echogenicity. Small cyst at mid upper RIGHT kidney 16 x 18 x 21 mm. Additional small cyst impaired foci at the central kidney favor mild collecting system dilatation over additional peripelvic cysts. No solid mass or shadowing calcification. Left Kidney: Length: 10.5 cm. Normal cortical thickness and echogenicity. Probable peripelvic cysts, largest 3.8 x 2.5 x 3.8 cm. No definite solid mass or hydronephrosis. No shadowing calcification. Bladder: Contains a Foley catheter. Heterogeneous material is seen within the bladder lumen with minimal urine. No blood flow within this collection on color  Doppler imaging. This could represent debris, clot, or tumor. A portion of the intraluminal material his hyperechoic with posterior shadowing question bladder calculus 2.3 cm diameter. Prostate gland appears enlarged. IMPRESSION: Probable peripelvic cysts LEFT kidney with peripelvic cyst versus mild collecting system dilatation at RIGHT kidney. Abnormal heterogeneous material within bladder lumen which could represent debris, hemorrhage, or tumor. Suspected bladder calculus and prostatic enlargement. Cystoscopic evaluation recommended to exclude neoplasm. Electronically Signed   By: Lavonia Dana M.D.   On: 01/08/2017 12:47   Dg Femur Min 2 Views Left  Result Date: Feb 06, 2017 CLINICAL DATA:  Left midshaft femur pain. EXAM: LEFT FEMUR 2 VIEWS COMPARISON:  None. FINDINGS: The femur appears osteopenic but intact and located. Possible left superior pubic ramus fracture, but area only covered on one view. No visible sclerotic bone lesion. Atherosclerotic calcification. IMPRESSION: 1. No evidence of fracture or metastasis in the left femur. 2. Possible nondisplaced left superior pubic ramus fracture, but area only covered on one view. Electronically Signed   By: Monte Fantasia M.D.   On: 02/06/2017 16:37    Microbiology Recent Results (from the past 240 hour(s))  Urine culture     Status: Abnormal   Collection Time: 02-06-17  6:31 PM  Result Value Ref Range Status   Specimen Description   Final    URINE, RANDOM Performed at [facility], [address] [place], [place] [address]    Special Requests   Final    NONE Performed at [facility], [address] [place],  [address]    Culture MULTIPLE SPECIES PRESENT, SUGGEST RECOLLECTION (A)  Final   Report Status 01/09/2017 FINAL  Final    Lab Basic  Metabolic Panel: Recent Labs  Lab 01/05/2017 1640  NA 128*  K 4.5  CL 97*  CO2 21*  GLUCOSE 178*  BUN 17  CREATININE 0.84  CALCIUM 8.6*   Liver Function Tests: No  results for input(s): AST, ALT, ALKPHOS, BILITOT, PROT, ALBUMIN in the last 168 hours. No results for input(s): LIPASE, AMYLASE in the last 168 hours. No results for input(s): AMMONIA in the last 168 hours. CBC: Recent Labs  Lab 02/01/2017 1640 01/08/17 0609  WBC 4.6 3.4*  NEUTROABS 4.2  --   HGB 10.5* 8.0*  HCT 32.1* 25.1*  MCV 95.3 96.8  PLT 482* 405   Cardiac Enzymes: No results for input(s): CKTOTAL, CKMB, CKMBINDEX, TROPONINI in the last 168 hours. Sepsis Labs: Recent Labs  Lab 01/24/2017 1640 01/08/17 0609  WBC 4.6 3.4*    Procedures/Operations  none   Polina Burmaster Manuella Ghazi 25-Jan-2017, 6:42 AM

## 2017-02-05 NOTE — Progress Notes (Addendum)
Pt passed away at [facility].  Family was in the room.  Pronounced by Dorna Bloom RN and Dru RN. Per [employer],  Pt has been ruled out as an eye donor and is now free to be released to the funeral home. [phone]) Family is still in the room. Dorna Bloom RN Body prepared and funeral home to come pick pt up in room. Dorna Bloom RN

## 2017-02-05 DEATH — deceased

## 2019-09-11 IMAGING — US US RENAL
1 series · 13 of 25 positions shown · non-contrast
Comparison: PET-CT 07/31/2009

CLINICAL DATA: Bladder obstruction

EXAM:
RENAL / URINARY TRACT ULTRASOUND COMPLETE

[Series 1: us renal · 0.23mm/px · 13 of 44 slices shown]
[im 1/44]
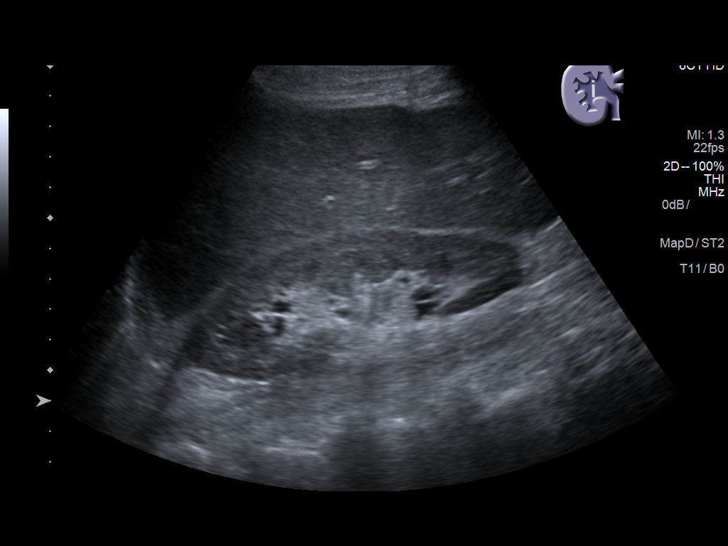
[im 4/44]
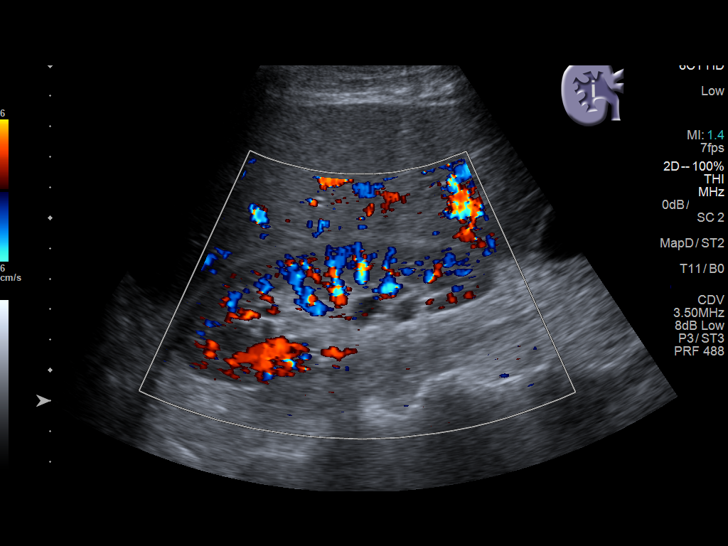
[im 8/44]
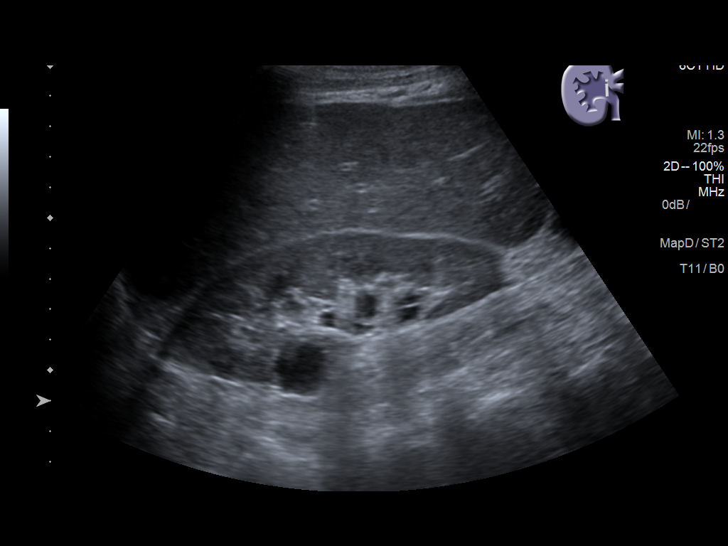
[im 11/44]
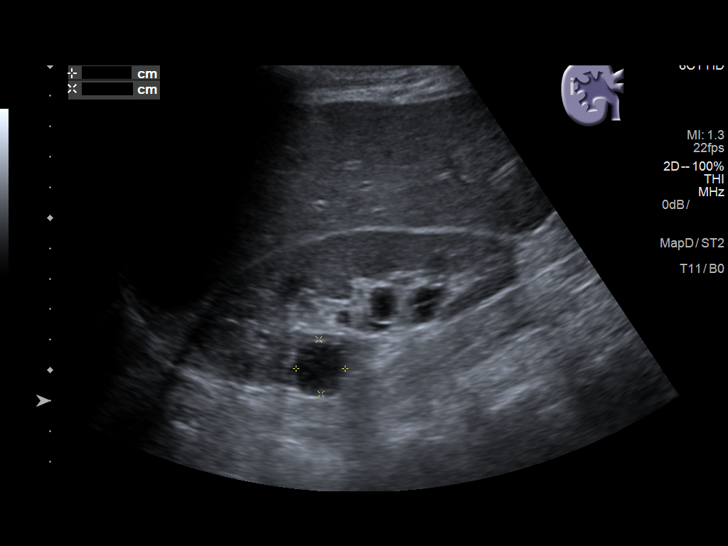
[im 15/44]
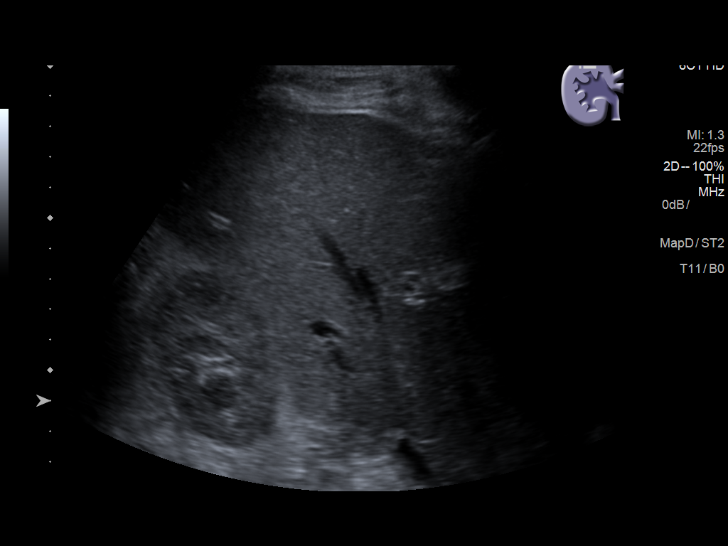
[im 18/44]
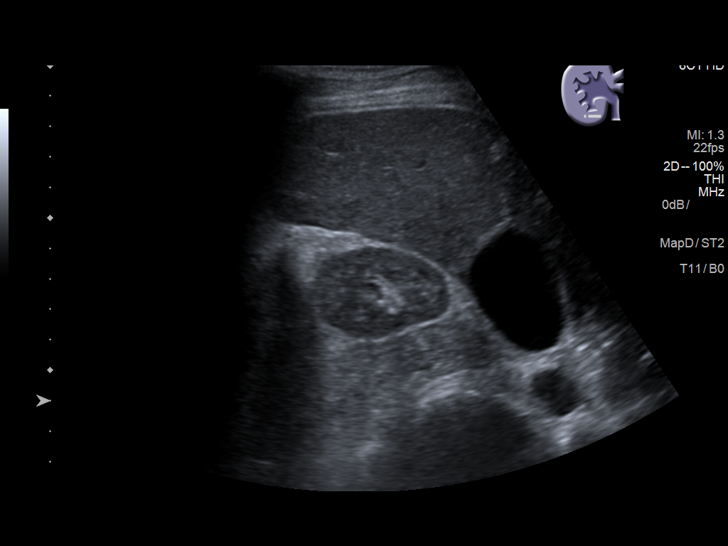
[im 22/44]
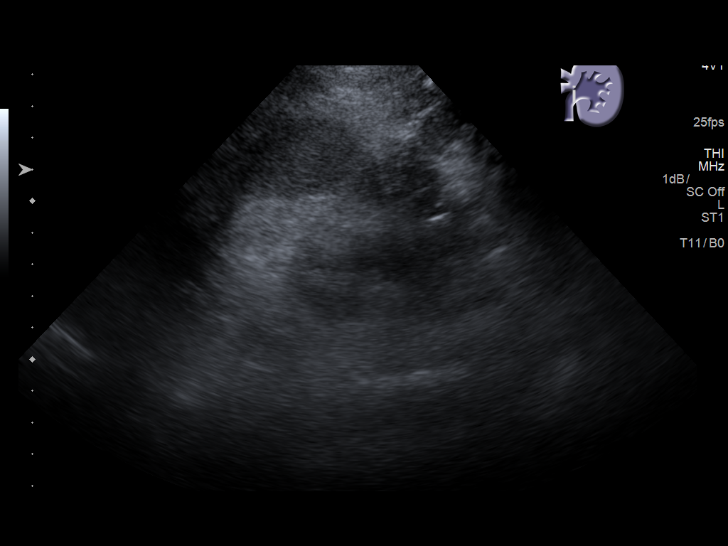
[im 26/44]
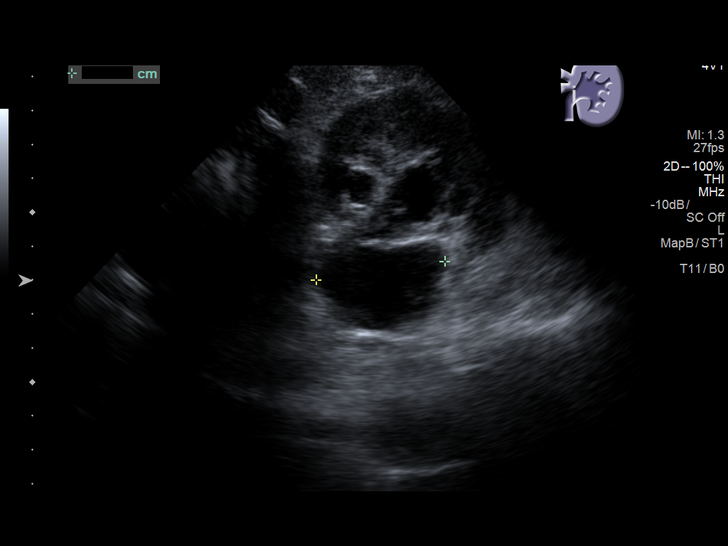
[im 29/44]
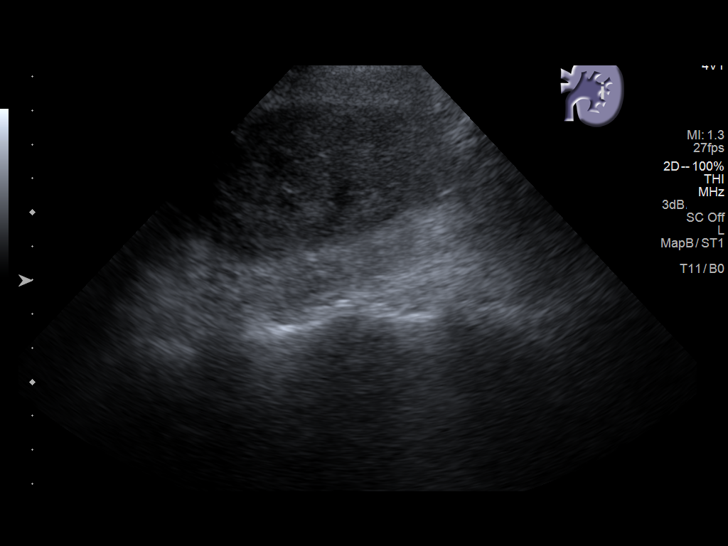
[im 33/44]
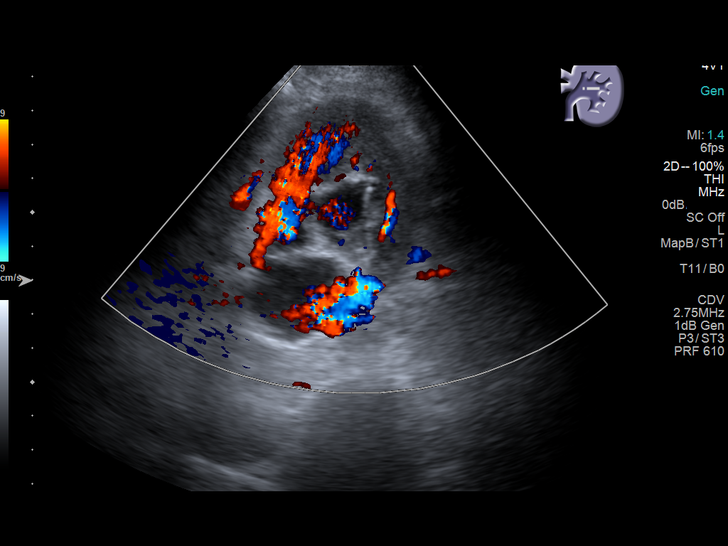
[im 36/44]
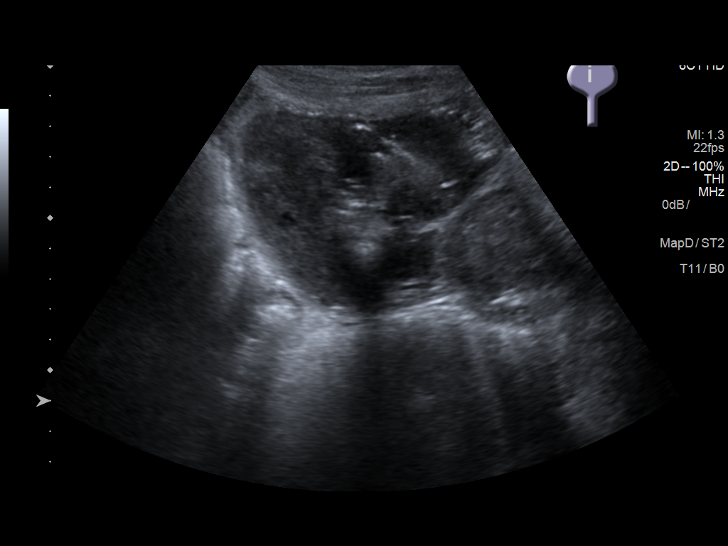
[im 40/44]
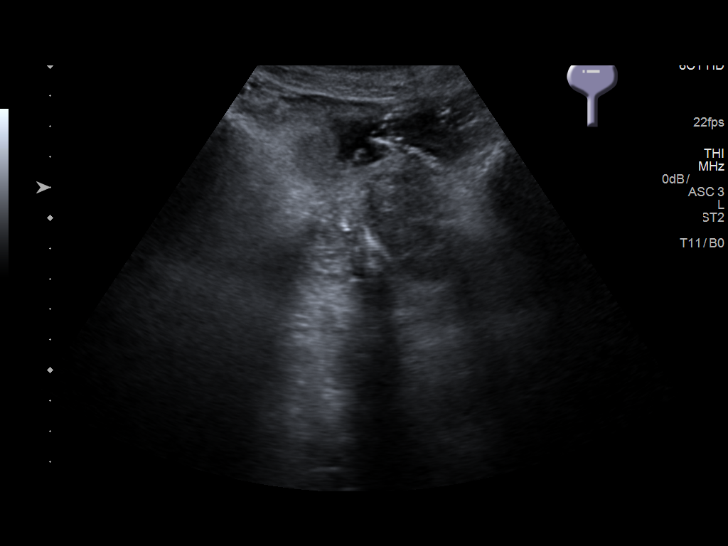
[im 44/44]
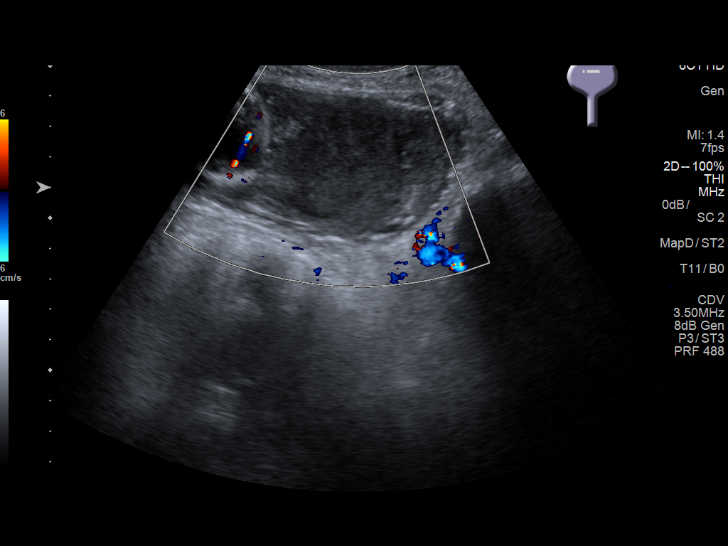

[13 of 25 positions shown; findings below may reference images not displayed]

FINDINGS: Right Kidney:

Length: 11.6 cm. Normal cortical thickness and echogenicity. Small
cyst at mid upper RIGHT kidney 16 x 18 x 21 mm. Additional small
cyst impaired foci at the central kidney favor mild collecting
system dilatation over additional peripelvic cysts. No solid mass or
shadowing calcification.

Left Kidney:

Length: 10.5 cm. Normal cortical thickness and echogenicity.
Probable peripelvic cysts, largest 3.8 x 2.5 x 3.8 cm. No definite
solid mass or hydronephrosis. No shadowing calcification.

Bladder:

Contains a Foley catheter. Heterogeneous material is seen within the
bladder lumen with minimal urine. No blood flow within this
collection on color Doppler imaging. This could represent debris,
clot, or tumor. A portion of the intraluminal material his
hyperechoic with posterior shadowing question bladder calculus
cm diameter. Prostate gland appears enlarged.
IMPRESSION: Probable peripelvic cysts LEFT kidney with peripelvic cyst versus
mild collecting system dilatation at RIGHT kidney.

Abnormal heterogeneous material within bladder lumen which could
represent debris, hemorrhage, or tumor.

Suspected bladder calculus and prostatic enlargement.

Cystoscopic evaluation recommended to exclude neoplasm.
